# Patient Record
Sex: Female | Born: 1935 | Race: Black or African American | Hispanic: No | Smoking: Never smoker
Health system: Southern US, Community
[De-identification: ages and names within clinical notes are randomized; demographics above are authoritative.]

## PROBLEM LIST (undated history)

## (undated) DIAGNOSIS — I10 Essential (primary) hypertension: Secondary | ICD-10-CM

## (undated) DIAGNOSIS — R55 Syncope and collapse: Secondary | ICD-10-CM

## (undated) DIAGNOSIS — R001 Bradycardia, unspecified: Secondary | ICD-10-CM

## (undated) DIAGNOSIS — I5032 Chronic diastolic (congestive) heart failure: Secondary | ICD-10-CM

## (undated) DIAGNOSIS — Z950 Presence of cardiac pacemaker: Secondary | ICD-10-CM

## (undated) DIAGNOSIS — M1990 Unspecified osteoarthritis, unspecified site: Secondary | ICD-10-CM

## (undated) DIAGNOSIS — I4891 Unspecified atrial fibrillation: Secondary | ICD-10-CM

## (undated) HISTORY — PX: HERNIA REPAIR: SHX51

## (undated) HISTORY — DX: Syncope and collapse: R55

## (undated) HISTORY — DX: Essential (primary) hypertension: I10

## (undated) HISTORY — DX: Unspecified atrial fibrillation: I48.91

## (undated) HISTORY — PX: OTHER SURGICAL HISTORY: SHX169

## (undated) HISTORY — DX: Presence of cardiac pacemaker: Z95.0

## (undated) HISTORY — DX: Bradycardia, unspecified: R00.1

## (undated) HISTORY — DX: Unspecified osteoarthritis, unspecified site: M19.90

## (undated) HISTORY — PX: CHOLECYSTECTOMY: SHX55

## (undated) HISTORY — DX: Chronic diastolic (congestive) heart failure: I50.32

---

## 2010-05-21 ENCOUNTER — Encounter: Payer: Self-pay | Admitting: Physician Assistant

## 2010-06-04 ENCOUNTER — Ambulatory Visit: Payer: Self-pay | Admitting: Internal Medicine

## 2010-06-04 ENCOUNTER — Encounter (INDEPENDENT_AMBULATORY_CARE_PROVIDER_SITE_OTHER): Payer: Self-pay | Admitting: Internal Medicine

## 2010-06-05 ENCOUNTER — Ambulatory Visit: Payer: Self-pay | Admitting: Vascular Surgery

## 2010-06-05 ENCOUNTER — Encounter (INDEPENDENT_AMBULATORY_CARE_PROVIDER_SITE_OTHER): Payer: Self-pay | Admitting: Internal Medicine

## 2010-06-05 ENCOUNTER — Encounter: Payer: Self-pay | Admitting: Internal Medicine

## 2010-06-07 ENCOUNTER — Encounter: Payer: Self-pay | Admitting: Internal Medicine

## 2010-06-13 ENCOUNTER — Ambulatory Visit: Payer: Self-pay | Admitting: Internal Medicine

## 2010-06-15 ENCOUNTER — Encounter: Payer: Self-pay | Admitting: Internal Medicine

## 2010-06-16 ENCOUNTER — Telehealth (INDEPENDENT_AMBULATORY_CARE_PROVIDER_SITE_OTHER): Payer: Self-pay | Admitting: *Deleted

## 2010-07-01 ENCOUNTER — Telehealth (INDEPENDENT_AMBULATORY_CARE_PROVIDER_SITE_OTHER): Payer: Self-pay | Admitting: *Deleted

## 2010-07-09 DIAGNOSIS — I5032 Chronic diastolic (congestive) heart failure: Secondary | ICD-10-CM

## 2010-07-09 DIAGNOSIS — G459 Transient cerebral ischemic attack, unspecified: Secondary | ICD-10-CM | POA: Insufficient documentation

## 2010-07-09 DIAGNOSIS — M4120 Other idiopathic scoliosis, site unspecified: Secondary | ICD-10-CM | POA: Insufficient documentation

## 2010-07-09 DIAGNOSIS — M129 Arthropathy, unspecified: Secondary | ICD-10-CM | POA: Insufficient documentation

## 2010-07-09 DIAGNOSIS — I4891 Unspecified atrial fibrillation: Secondary | ICD-10-CM | POA: Insufficient documentation

## 2010-07-10 ENCOUNTER — Ambulatory Visit: Payer: Self-pay | Admitting: Internal Medicine

## 2010-07-17 ENCOUNTER — Ambulatory Visit: Payer: Self-pay | Admitting: Cardiology

## 2010-07-17 LAB — CONVERTED CEMR LAB: POC INR: 1.2

## 2010-07-22 ENCOUNTER — Ambulatory Visit: Payer: Self-pay | Admitting: Physician Assistant

## 2010-07-24 ENCOUNTER — Ambulatory Visit: Payer: Self-pay | Admitting: Internal Medicine

## 2010-07-24 LAB — CONVERTED CEMR LAB: POC INR: 1.8

## 2010-07-31 ENCOUNTER — Ambulatory Visit: Payer: Self-pay | Admitting: Internal Medicine

## 2010-07-31 LAB — CONVERTED CEMR LAB: POC INR: 1.9

## 2010-08-07 ENCOUNTER — Ambulatory Visit: Payer: Self-pay | Admitting: Internal Medicine

## 2010-08-07 ENCOUNTER — Ambulatory Visit: Payer: Self-pay | Admitting: Cardiovascular Disease

## 2010-08-14 ENCOUNTER — Ambulatory Visit: Payer: Self-pay | Admitting: Internal Medicine

## 2010-08-14 LAB — CONVERTED CEMR LAB: POC INR: 2

## 2010-08-15 ENCOUNTER — Encounter: Payer: Self-pay | Admitting: Cardiovascular Disease

## 2010-09-04 ENCOUNTER — Encounter: Payer: Self-pay | Admitting: Physician Assistant

## 2010-09-10 ENCOUNTER — Telehealth: Payer: Self-pay | Admitting: Internal Medicine

## 2010-09-25 ENCOUNTER — Encounter: Payer: Self-pay | Admitting: Internal Medicine

## 2010-09-25 ENCOUNTER — Inpatient Hospital Stay (HOSPITAL_COMMUNITY): Admission: EM | Admit: 2010-09-25 | Discharge: 2010-06-07 | Payer: Self-pay | Admitting: Emergency Medicine

## 2010-10-02 ENCOUNTER — Ambulatory Visit: Payer: Self-pay | Admitting: Internal Medicine

## 2010-10-02 ENCOUNTER — Encounter: Payer: Self-pay | Admitting: Internal Medicine

## 2010-10-02 DIAGNOSIS — R609 Edema, unspecified: Secondary | ICD-10-CM | POA: Insufficient documentation

## 2010-10-02 DIAGNOSIS — I110 Hypertensive heart disease with heart failure: Secondary | ICD-10-CM | POA: Insufficient documentation

## 2010-10-02 DIAGNOSIS — I509 Heart failure, unspecified: Secondary | ICD-10-CM

## 2010-10-07 ENCOUNTER — Telehealth (INDEPENDENT_AMBULATORY_CARE_PROVIDER_SITE_OTHER): Payer: Self-pay | Admitting: *Deleted

## 2010-10-17 ENCOUNTER — Ambulatory Visit: Payer: Self-pay | Admitting: Internal Medicine

## 2010-10-17 ENCOUNTER — Ambulatory Visit: Payer: Self-pay | Admitting: Cardiology

## 2010-10-21 ENCOUNTER — Encounter: Payer: Self-pay | Admitting: Physician Assistant

## 2010-10-22 ENCOUNTER — Encounter: Payer: Self-pay | Admitting: Physician Assistant

## 2010-10-22 ENCOUNTER — Other Ambulatory Visit: Payer: Self-pay | Admitting: Internal Medicine

## 2010-10-22 LAB — BASIC METABOLIC PANEL
BUN: 15 mg/dL (ref 6–23)
CO2: 32 mEq/L (ref 19–32)
Calcium: 9.8 mg/dL (ref 8.4–10.5)
Chloride: 103 mEq/L (ref 96–112)
Creatinine, Ser: 0.8 mg/dL (ref 0.4–1.2)
GFR: 87.58 mL/min (ref >60.00)
Glucose, Bld: 92 mg/dL (ref 70–99)
Potassium: 4.2 mEq/L (ref 3.5–5.1)
Sodium: 143 mEq/L (ref 135–145)

## 2010-10-27 ENCOUNTER — Ambulatory Visit
Admission: RE | Admit: 2010-10-27 | Discharge: 2010-10-27 | Payer: Self-pay | Source: Home / Self Care | Attending: Physician Assistant | Admitting: Physician Assistant

## 2010-10-27 ENCOUNTER — Other Ambulatory Visit: Payer: Self-pay | Admitting: Physician Assistant

## 2010-10-27 LAB — BASIC METABOLIC PANEL
BUN: 17 mg/dL (ref 6–23)
CO2: 32 mEq/L (ref 19–32)
Calcium: 9.8 mg/dL (ref 8.4–10.5)
Chloride: 104 mEq/L (ref 96–112)
Creatinine, Ser: 1.1 mg/dL (ref 0.4–1.2)
GFR: 58.79 mL/min — ABNORMAL LOW (ref >60.00)
Glucose, Bld: 80 mg/dL (ref 70–99)
Potassium: 3.9 mEq/L (ref 3.5–5.1)
Sodium: 143 mEq/L (ref 135–145)

## 2010-10-28 ENCOUNTER — Encounter: Payer: Self-pay | Admitting: Physician Assistant

## 2010-11-04 ENCOUNTER — Encounter: Payer: Self-pay | Admitting: Physician Assistant

## 2010-11-13 ENCOUNTER — Ambulatory Visit
Admission: RE | Admit: 2010-11-13 | Discharge: 2010-11-13 | Payer: Self-pay | Source: Home / Self Care | Attending: Physician Assistant | Admitting: Physician Assistant

## 2010-11-13 ENCOUNTER — Other Ambulatory Visit: Payer: Self-pay | Admitting: Physician Assistant

## 2010-11-13 DIAGNOSIS — I10 Essential (primary) hypertension: Secondary | ICD-10-CM | POA: Insufficient documentation

## 2010-11-14 ENCOUNTER — Telehealth: Payer: Self-pay | Admitting: Physician Assistant

## 2010-11-14 ENCOUNTER — Encounter: Payer: Self-pay | Admitting: Physician Assistant

## 2010-11-14 LAB — BASIC METABOLIC PANEL
BUN: 17 mg/dL (ref 6–23)
CO2: 33 mEq/L — ABNORMAL HIGH (ref 19–32)
Calcium: 9.7 mg/dL (ref 8.4–10.5)
Potassium: 4.5 mEq/L (ref 3.5–5.1)
Sodium: 142 mEq/L (ref 135–145)

## 2010-11-14 LAB — BRAIN NATRIURETIC PEPTIDE: Pro B Natriuretic peptide (BNP): 174.9 pg/mL — ABNORMAL HIGH (ref 0.0–100.0)

## 2010-11-18 NOTE — Medication Information (Signed)
Summary: rov/ewj  Anticoagulant Therapy  Managed by: Cloyde Reams, RN, BSN Referring MD: Graciela Husbands PCP: Ferdinand Cava, MD Supervising MD: Ladona Ridgel MD, Sharlot Gowda Indication 1: Atrial Fibrillation Lab Used: [facility] [place] Site: [address] INR POC 1.9 INR RANGE 2.0-3.0  Dietary changes: no    Health status changes: no    Bleeding/hemorrhagic complications: no    Recent/future hospitalizations: no    Any changes in medication regimen? yes       Details: changed prilosec to omeprazole  Recent/future dental: no  Any missed doses?: no       Is patient compliant with meds? yes       Allergies: No Known Drug Allergies  Anticoagulation Management History:      The patient is taking warfarin and comes in today for a routine follow up visit.  Positive risk factors for bleeding include an age of 75 years or older and history of CVA/TIA.  The bleeding index is 'intermediate risk'.  Positive CHADS2 values include History of CHF, History of HTN, Age > 41 years old, and Prior Stroke/CVA/TIA.  Anticoagulation responsible provider: Ladona Ridgel MD, Sharlot Gowda.  INR POC: 1.9.  Cuvette Lot#: [id number].  Exp: 08/2011.    Anticoagulation Management Assessment/Plan:      The target INR is 2.0-3.0.  The next INR is due 08/07/2010.  Results were reviewed/authorized by Cloyde Reams, RN, BSN.  She was notified by Cloyde Reams RN.         Prior Anticoagulation Instructions: INR 1.8  Take 1 extra tablet today, then start taking 2 tablets daily except 1 tablet on Mondays, Wednesdays, and Fridays.  Recheck in 1 week.    Current Anticoagulation Instructions: INR 1.9  Take 2 tablets tomorrow, then start taking 2 tablets daily except 1 tablet on Mondays and Fridays.  Recheck in 1 week.

## 2010-11-18 NOTE — Letter (Signed)
Summary: [facility]   Imported By: Roderic Ovens 06/20/2010 15:59:45  _____________________________________________________________________  External Attachment:    Type:   Image     Comment:   External Document

## 2010-11-18 NOTE — Medication Information (Signed)
Summary: [facility]  Anticoagulant Therapy  Managed by: Inactive Referring MD: Graciela Husbands PCP: Ferdinand Cava, MD Supervising MD: Excell Seltzer MD, Casimiro Needle Indication 1: Atrial Fibrillation Lab Used: [facility]  Site: [address] INR RANGE 2.0-3.0          Comments: Pt returning home to [place], [place]. Here Primary Cardiologist Dr Donzetta Kohut daughter states will take over INR dosing. Thus, [facility] report faxed to [phone].  Office # [phone]  Allergies: No Known Drug Allergies  Anticoagulation Management History:      Positive risk factors for bleeding include an age of 19 years or older and history of CVA/TIA.  The bleeding index is 'intermediate risk'.  Positive CHADS2 values include History of CHF, History of HTN, Age > 59 years old, and Prior Stroke/CVA/TIA.  Anticoagulation responsible Felicity Penix: Excell Seltzer MD, Casimiro Needle.  Exp: 08/2011.    Anticoagulation Management Assessment/Plan:      The patient's current anticoagulation dose is Coumadin 5 mg tabs: Use as directed by anticoagulation clinic..  The target INR is 2.0-3.0.  The next INR is due 08/28/2010.  Results were reviewed/authorized by Inactive.         Prior Anticoagulation Instructions: INR 2.0  Take Coumadin 1 tab (5 mg) on all days except Coumadin 0.5 tab (2.5 mg) on Mondays. Return to clinic in 2 weeks.  Please call clinic if you will establish care closer to home.

## 2010-11-18 NOTE — Progress Notes (Signed)
  Phone Note Outgoing Call   Call placed by: Scherrie Bateman, LPN,  June 16, 2010 10:57 AM Call placed to: [facility] Summary of Call: [facility] AWARE TO FORWARD REFERRAL ORDER  TO PMD.  Initial call taken by: Scherrie Bateman, LPN,  June 16, 2010 10:57 AM

## 2010-11-18 NOTE — Medication Information (Signed)
Summary: new afib/sp  Anticoagulant Therapy  Managed by: Weston Brass, PharmD Referring MD: Graciela Husbands PCP: Ferdinand Cava, MD Supervising MD: Shirlee Latch MD, [place] Indication 1: Atrial Fibrillation INR POC 1.2 INR RANGE 2.0-3.0  Dietary changes: yes       Details: eats greens every day  Health status changes: no    Bleeding/hemorrhagic complications: no    Recent/future hospitalizations: no    Any changes in medication regimen? no    Recent/future dental: no  Any missed doses?: no       Is patient compliant with meds? yes       Allergies: No Known Drug Allergies  Anticoagulation Management History:      The patient is taking warfarin and comes in today for a routine follow up visit.  Positive risk factors for bleeding include an age of 75 years or older and history of CVA/TIA.  The bleeding index is 'intermediate risk'.  Positive CHADS2 values include History of CHF, History of HTN, Age > 64 years old, and Prior Stroke/CVA/TIA.  Anticoagulation responsible provider: Shirlee Latch MD, [place].  INR POC: 1.2.  Cuvette Lot#: [id number].  Exp: 08/2011.    Anticoagulation Management Assessment/Plan:      The next INR is due 07/24/2010.  Results were reviewed/authorized by Weston Brass, PharmD.  She was notified by Kennieth Francois.         Current Anticoagulation Instructions: INR 1.2  Take one extra tablet when you get home today, then tomorrow begin new dose of 2 tablets on Sunday, Tuesday, Thursday, and Saturday, and 1 tablet on Monday, Wednesday, and Friday.  Recheck INR in one week.

## 2010-11-18 NOTE — Assessment & Plan Note (Signed)
Summary: 4 week eph/mt   Visit Type:  Follow-up Primary Provider:  Ferdinand Cava, MD   History of Present Illness: Connie Weaver is seen in followup for hospitalization for syncope in whichshe is found to be bradycardic. She also been noted to have atrial fibrillation at a prior hospitalization.  Outpatient monitoring was undertaken to try to clarify the relative contributions of bradycardia and atrial fibrillation.  this demonstrated frequent episodes of atrial fibrillation. There is no significant bradycardia.  Thromboembolic risk factors include gender, age, hypertension, prior TIA.  Current Medications (verified): 1)  Hydrocortisone 1 % Crea (Hydrocortisone) .... Uad 2)  Aggrenox 25-200 Mg Xr12h-Cap (Aspirin-Dipyridamole) .... Two Times A Day 3)  Dok 100 Mg Caps (Docusate Sodium) .... As Needed 4)  Ginkgo Biloba 60 Mg Tabs (Ginkgo Biloba) .... Once Daily 5)  Glucosamine-Chondroitin  Caps (Glucosamine-Chondroit-Vit C-Mn) .... Once Daily 6)  Magnesium Oxide 400 Mg Caps (Magnesium Oxide) .... Once Daily 7)  Naproxen Sodium 220 Mg Tabs (Naproxen Sodium) .... As Needed 8)  Prilosec 40 Mg Cpdr (Omeprazole) .... Once Daily 9)  Tylenol 8 Hour 650 Mg Cr-Tabs (Acetaminophen) .... As Needed 10)  Vitamin C 500 Mg  Tabs (Ascorbic Acid) .... Once Daily 11)  Zinc 25 Mg Tabs (Zinc) .... Once Daily  Allergies (verified): No Known Drug Allergies  Past History:  Past Medical History: Last updated: 07/09/2010 1. Possible TIA.   2. Hypertension.   3. Congestive heart failure.   4. Arthritis.   5. History of atrial fibrillation.   6. Scoliosis.   Vital Signs:  Patient profile:   77 year old female Height:      64 inches Weight:      239 pounds BMI:     41.17 Pulse rate:   71 / minute Pulse rhythm:   regular BP sitting:   132 / 78  (left arm)  Vitals Entered By: Laurance Flatten CMA (July 10, 2010 3:13 PM)  Physical Exam  General:  The patient was alert and oriented in no acute  distress. HEENT Normal.  Neck veins were flat, carotids were brisk.  Lungs were clear.  Heart sounds were regular without murmurs or gallops.  Abdomen was soft with active bowel sounds. There is no clubbing cyanosis or edema. Skin Warm and dry    EKG  Procedure date:  07/10/2010  Findings:      sinus rhythm at 71 Intervals 0.18/0.08/0.39 Axis is -19 Otherwise normal  Impression & Recommendations:  Problem # 1:  ATRIAL FIBRILLATION (ICD-427.31) the patient has atrial fibrillation with a rapid ventricular response. We are going to get caught in a dilemma of tachybradycardia syndrome and the patient will likely end up requiring pacing.  The more immediate issue however is thromboembolic risk reduction. The patient is on aggrenx currently. This is inadequate. We'll begin her on Coumadin. I reviewed extensively with the family for greater than 25 minutes the physiology as well as potential risks and benefits of The following medications were removed from the medication list:    Aggrenox 25-200 Mg Xr12h-cap (Aspirin-dipyridamole) .[name]..[name] Two times a day  Problem # 2:  HYPERTENSION (ICD-401.9) well controlled for now  Patient Instructions: 1)  Your physician has recommended you make the following change in your medication: Start Coumadin. Stop Ginko.

## 2010-11-18 NOTE — Progress Notes (Signed)
Summary: Pt daughter request call have question  Phone Note Call from Patient Call back at Home Phone [phone]   Caller: Daughter/ Connie Weaver Summary of Call: Pt daughter request call Initial call taken by: Judie Grieve,  September 10, 2010 8:21 AM  Follow-up for Phone Call        Spoke with pt's daugher.  Mom wants to take Garlic for cholesterol and daughter wants to make sure will not interfer with Coumadin.  Explained that may not increase INR but can cause increase risk of bleeding because it has some antiplatelet effects.  If mother wants something OTC for cholesterol, suggested red yeast rice or fiber supplements.  Follow-up by: Weston Brass PharmD,  September 10, 2010 8:38 AM

## 2010-11-18 NOTE — Procedures (Signed)
Summary: Summary Report  Summary Report   Imported By: Erle Crocker 07/18/2010 16:28:10  _____________________________________________________________________  External Attachment:    Type:   Image     Comment:   External Document

## 2010-11-18 NOTE — Assessment & Plan Note (Signed)
Summary: rov-shob-holter f/u   Visit Type:  rov Primary Amilah Greenspan:  Ferdinand Cava, MD  CC:  shortness of breath and .  History of Present Illness: Mrs. Clucas is seen in followup for hospitalization for syncope in whichshe is found to be bradycardic. She also been noted to have atrial fibrillation at a prior hospitalization. intercurrent event recording demonstrated a half with a rapid ventricular response. her hospital course is complicated by renal insufficiency aggravated by diuretic and ACE inhibitors both of which were stopped.  He is overall doing much better. She is getting around with less shortness of breath. She does have episodes where she gets short of breath that lasts for a few minutes or so and then resolves. There has been no significant peripheral edema.  a.  Thromboembolic risk factors include gender, age, hypertension, prior TIA.  Current Medications (verified): 1)  Hydrocortisone 1 % Crea (Hydrocortisone) .... Uad 2)  Dok 100 Mg Caps (Docusate Sodium) .... As Needed 3)  Glucosamine-Chondroitin  Caps (Glucosamine-Chondroit-Vit C-Mn) .... Once Daily 4)  Magnesium Oxide 400 Mg Caps (Magnesium Oxide) .... Once Daily 5)  Prilosec 40 Mg Cpdr (Omeprazole) .... Once Daily 6)  Tylenol 8 Hour 650 Mg Cr-Tabs (Acetaminophen) .... As Needed 7)  Vitamin C 500 Mg  Tabs (Ascorbic Acid) .... Once Daily 8)  Warfarin Sodium 2.5 Mg Tabs (Warfarin Sodium) .... Use As Directed By Anticoagualtion Clinic  Allergies (verified): No Known Drug Allergies  Past History:  Past Medical History: Last updated: 07/09/2010 1. Possible TIA.   2. Hypertension.   3. Congestive heart failure.   4. Arthritis.   5. History of atrial fibrillation.   6. Scoliosis.   Past Surgical History: Last updated: 07/09/2010  Status post hernia repair.  Status post cholecystectomy.  Status post carpal tunnel repair.  Family History: Last updated: 07/09/2010  Noncontributory due to the patient's age.     Social History: Last updated: 07/09/2010 The patient currently lives in [place] with her   daughter.  She is from a smaller town outside of this city.  She has 10   people in her family.  She denies any tobacco, alcohol, or illicit drug   use.      Vital Signs:  Patient profile:   77 year old female Height:      64 inches Weight:      253 pounds BMI:     43.58 Pulse rate:   82 / minute BP sitting:   116 / 80  (left arm) Cuff size:   regular  Vitals Entered By: Caralee Ates CMA (August 07, 2010 4:01 PM)  Physical Exam  General:  The patient was alert and oriented in no acute distress.Neck veins were flat, carotids were brisk. Lungs were clear. Heart sounds were irregular without murmurs or gallops. Abdomen was soft with active bowel sounds. There is no clubbing cyanosis; mild edema. She is walking with a walker    Impression & Recommendations:  Problem # 1:  ATRIAL FIBRILLATION (ICD-427.31) she is in atrial fibrillation today and is without symptoms today suggesting atrial fibrillation in of itself is not a major issue. Her Holter/event recorder demonstrated atrial fibrillation with rapid ventricular response occasionally with heart rates in the 150 range.so much of her symptoms may be related to rate.  alternatively the SOB may be 2/2 fluid accumulation Her updated medication list for this problem includes:    Warfarin Sodium 2.5 Mg Tabs (Warfarin sodium) ..... Use as directed by anticoagualtion clinic  Problem # 2:  CHF (ICD-428.0) her CHF is much improved so we will be very careful about the initiation of diuretics Her updated medication list for this problem includes:    Warfarin Sodium 2.5 Mg Tabs (Warfarin sodium) ..... Use as directed by anticoagualtion clinic  Patient Instructions: 1)  Your physician recommends that you schedule a follow-up appointment in: 8 weeks 2)  Your physician recommends that you continue on your current medications as directed. Please  refer to the Current Medication list given to you today.

## 2010-11-18 NOTE — Progress Notes (Signed)
  Phone Note Outgoing Call   Call placed by: Scherrie Bateman, LPN,  July 01, 2010 12:59 PM Call placed to: Patient Summary of Call: DAUGHTER LEFT MESSAGE THAT PT IS GETTING MONITOR WANTS TO KNOW IF PT NEEDS  APPT.HAS APPT IN COMPUTER TO SEE DR Graciela Husbands ON 07/10/10 AT 2:30  ATTEMPTED TO CALL PT AND INFORM OF ABOVE UNABLE TO LEAVE MESSAGE AT NUMBER LISTED IN SYSTEM. Initial call taken by: Scherrie Bateman, LPN,  July 01, 2010 1:02 PM

## 2010-11-18 NOTE — Medication Information (Signed)
Summary: rov/eac  Anticoagulant Therapy  Managed by: Reina Fuse, PharmD Referring MD: Graciela Husbands PCP: Ferdinand Cava, MD Supervising MD: Gala Romney MD, Reuel Boom Indication 1: Atrial Fibrillation Lab Used: [facility] [place] Site: [address] INR POC 2.0 INR RANGE 2.0-3.0  Dietary changes: no    Health status changes: no    Bleeding/hemorrhagic complications: no    Recent/future hospitalizations: no    Any changes in medication regimen? no    Recent/future dental: no  Any missed doses?: no       Is patient compliant with meds? yes       Allergies: No Known Drug Allergies  Anticoagulation Management History:      The patient is taking warfarin and comes in today for a routine follow up visit.  Positive risk factors for bleeding include an age of 75 years or older and history of CVA/TIA.  The bleeding index is 'intermediate risk'.  Positive CHADS2 values include History of CHF, History of HTN, Age > 79 years old, and Prior Stroke/CVA/TIA.  Anticoagulation responsible provider: Bensimhon MD, Reuel Boom.  INR POC: 2.0.  Cuvette Lot#: [id number].  Exp: 08/2011.    Anticoagulation Management Assessment/Plan:      The patient's current anticoagulation dose is Coumadin 5 mg tabs: Use as directed by anticoagulation clinic..  The target INR is 2.0-3.0.  The next INR is due 08/28/2010.  Results were reviewed/authorized by Reina Fuse, PharmD.  She was notified by Reina Fuse PharmD.         Prior Anticoagulation Instructions: INR 1.8  Take an extra tablet today.  Then Start NEW dosing schedule of 2 tablets every day, except 1 tablet on Friday.  Return to clinic in 1 week.    Current Anticoagulation Instructions: INR 2.0  Take Coumadin 1 tab (5 mg) on all days except Coumadin 0.5 tab (2.5 mg) on Mondays. Return to clinic in 2 weeks.  Please call clinic if you will establish care closer to home.  Prescriptions: COUMADIN 5 MG TABS (WARFARIN SODIUM) Use as directed by  anticoagulation clinic.  #[phone] x 0   Entered by:   Reina Fuse PharmD   Authorized by:   Dolores Patty, MD, [facility]   Signed by:   Reina Fuse PharmD on 08/14/2010   Method used:   Electronically to        [facility] [phone] (retail)       [address]       [place], [place]  [address]       Ph: [phone]       Fax: [phone]   RxID:   [id number]

## 2010-11-18 NOTE — Miscellaneous (Signed)
  Clinical Lists Changes  Medications: Added new medication of HYDROCORTISONE 1 % CREA (HYDROCORTISONE) UAD Added new medication of AGGRENOX 25-200 MG XR12H-CAP (ASPIRIN-DIPYRIDAMOLE) two times a day Added new medication of DOK 100 MG CAPS (DOCUSATE SODIUM) as needed Added new medication of GINKGO BILOBA 60 MG TABS (GINKGO BILOBA) once daily Added new medication of GLUCOSAMINE-CHONDROITIN  CAPS (GLUCOSAMINE-CHONDROIT-VIT C-MN) once daily Added new medication of MAGNESIUM OXIDE 400 MG CAPS (MAGNESIUM OXIDE) once daily Added new medication of NAPROXEN SODIUM 220 MG TABS (NAPROXEN SODIUM) as needed Added new medication of PRILOSEC 40 MG CPDR (OMEPRAZOLE) once daily Added new medication of TYLENOL 8 HOUR 650 MG CR-TABS (ACETAMINOPHEN) as needed Added new medication of VITAMIN C 500 MG  TABS (ASCORBIC ACID) once daily Added new medication of ZINC 25 MG TABS (ZINC) once daily

## 2010-11-18 NOTE — Medication Information (Signed)
Summary: rov/jm  Anticoagulant Therapy  Managed by: Cloyde Reams, RN, BSN Referring MD: Graciela Husbands PCP: Ferdinand Cava, MD Supervising MD: Johney Frame MD, Fayrene Fearing Indication 1: Atrial Fibrillation Lab Used: [facility] [place] Site: [address] INR POC 1.8 INR RANGE 2.0-3.0  Dietary changes: no    Health status changes: no    Bleeding/hemorrhagic complications: no    Recent/future hospitalizations: no    Any changes in medication regimen? no    Recent/future dental: no  Any missed doses?: no       Is patient compliant with meds? yes      Comments: Pt has been alternating 2 tablets/1 tablet every other day.   Allergies: No Known Drug Allergies  Anticoagulation Management History:      The patient is taking warfarin and comes in today for a routine follow up visit.  Positive risk factors for bleeding include an age of 27 years or older and history of CVA/TIA.  The bleeding index is 'intermediate risk'.  Positive CHADS2 values include History of CHF, History of HTN, Age > 58 years old, and Prior Stroke/CVA/TIA.  Anticoagulation responsible provider: Allred MD, Fayrene Fearing.  INR POC: 1.8.  Cuvette Lot#: [id number].  Exp: 08/2011.    Anticoagulation Management Assessment/Plan:      The target INR is 2.0-3.0.  The next INR is due 07/31/2010.  Results were reviewed/authorized by Cloyde Reams, RN, BSN.  She was notified by Cloyde Reams RN.         Prior Anticoagulation Instructions: INR 1.2  Take one extra tablet when you get home today, then tomorrow begin new dose of 2 tablets on Sunday, Tuesday, Thursday, and Saturday, and 1 tablet on Monday, Wednesday, and Friday.  Recheck INR in one week.    Current Anticoagulation Instructions: INR 1.8  Take 1 extra tablet today, then start taking 2 tablets daily except 1 tablet on Mondays, Wednesdays, and Fridays.  Recheck in 1 week.

## 2010-11-18 NOTE — Medication Information (Signed)
Summary: Connie Weaver  Anticoagulant Therapy  Managed by: Eda Keys, PharmD Referring MD: Graciela Husbands PCP: Ferdinand Cava, MD Supervising MD: Eden Emms MD, Theron Arista Indication 1: Atrial Fibrillation Lab Used: [facility] [place] Site: [address] INR POC 1.8 INR RANGE 2.0-3.0  Dietary changes: no    Health status changes: no    Bleeding/hemorrhagic complications: no    Recent/future hospitalizations: no    Any changes in medication regimen? no    Recent/future dental: no  Any missed doses?: no       Is patient compliant with meds? yes       Allergies: No Known Drug Allergies  Anticoagulation Management History:      The patient is taking warfarin and comes in today for a routine follow up visit.  Positive risk factors for bleeding include an age of 75 years or older and history of CVA/TIA.  The bleeding index is 'intermediate risk'.  Positive CHADS2 values include History of CHF, History of HTN, Age > 29 years old, and Prior Stroke/CVA/TIA.  Anticoagulation responsible provider: Eden Emms MD, Theron Arista.  INR POC: 1.8.  Cuvette Lot#: [id number].  Exp: 08/2011.    Anticoagulation Management Assessment/Plan:      The target INR is 2.0-3.0.  The next INR is due 08/14/2010.  Results were reviewed/authorized by Eda Keys, PharmD.         Prior Anticoagulation Instructions: INR 1.9  Take 2 tablets tomorrow, then start taking 2 tablets daily except 1 tablet on Mondays and Fridays.  Recheck in 1 week.    Current Anticoagulation Instructions: INR 1.8  Take an extra tablet today.  Then Start NEW dosing schedule of 2 tablets every day, except 1 tablet on Friday.  Return to clinic in 1 week.

## 2010-11-18 NOTE — Procedures (Signed)
Summary: Summary Report  Summary Report   Imported By: Erle Crocker 07/17/2010 14:36:25  _____________________________________________________________________  External Attachment:    Type:   Image     Comment:   External Document

## 2010-11-20 ENCOUNTER — Encounter: Payer: Self-pay | Admitting: Physician Assistant

## 2010-11-20 LAB — CONVERTED CEMR LAB
BUN: 14 mg/dL (ref 6–23)
BUN: 20 mg/dL
CO2: 26 meq/L
CO2: 32 meq/L (ref 19–32)
Calcium: 9.8 mg/dL (ref 8.4–10.5)
Chloride: 105 meq/L (ref 96–112)
Creatinine, Ser: 1 mg/dL (ref 0.4–1.2)
Creatinine, Ser: 1.04 mg/dL
GFR calc non Af Amer: 68.49 mL/min (ref >60.00)
Glucose, Bld: 88 mg/dL (ref 70–99)
Potassium: 4.2 meq/L
Potassium: 4.4 meq/L (ref 3.5–5.1)

## 2010-11-20 NOTE — Assessment & Plan Note (Signed)
Summary: per check out/sf   Visit Type:  Follow-up Primary Provider:  Ferdinand Cava, MD  CC:  shortness of breath.  History of Present Illness: Connie Weaver is seen in followup for hospitalization for syncope in whichshe is found to be bradycardic. She also been noted to have atrial fibrillation at a prior hospitalization. intercurrent event recording demonstrated afib with a rapid ventricular response. her hospital course was complicated by renal insufficiency aggravated by diuretic and ACE inhibitors both of which were stopped.Looking at the discharge papers from her [facility] hospitalization but Gunnar Fusi stopped and then 2 for reasons that are not clear. I'm not sure the blood pressure or bradycardia.  She continues to suffer from exercise intolerance. She also is having problems with peripheral edema.    Thromboembolic risk factors include gender, age, hypertension, prior TIA.  Hypertension History:      Positive major cardiovascular risk factors include female age 75 years old or older and hypertension.        Positive history for target organ damage include cardiac end organ damage (either CHF or LVH) and prior stroke (or TIA).     Problems Prior to Update: 1)  Atrial Fibrillation  (ICD-427.31) 2)  Scoliosis  (ICD-737.30) 3)  Arthritis  (ICD-716.90) 4)  CHF  (ICD-428.0) 5)  Hypertension  (ICD-401.9) 6)  Tia  (ICD-435.9)  Current Medications (verified): 1)  Hydrocortisone 1 % Crea (Hydrocortisone) .... Uad 2)  Dok 100 Mg Caps (Docusate Sodium) .... As Needed 3)  Cvs Glucosamine-Chondroitin 500-400 Mg Tabs (Glucosamine-Chondroitin) .... By Mouth Once Daily 4)  Magnesium Oxide 400 Mg Caps (Magnesium Oxide) .... Once Daily 5)  Prilosec 40 Mg Cpdr (Omeprazole) .... Once Daily 6)  Tylenol 8 Hour 650 Mg Cr-Tabs (Acetaminophen) .... As Needed 7)  Vitamin C 500 Mg  Tabs (Ascorbic Acid) .... Once Daily 8)  Coumadin 5 Mg Tabs (Warfarin Sodium) .... Use As Directed By Anticoagulation  Clinic. 9)  Zinc 25 Mg Tabs (Zinc) .... By Mouth Once Daily  Allergies (verified): No Known Drug Allergies  Past History:  Past Medical History: Last updated: 07/09/2010 1. Possible TIA.   2. Hypertension.   3. Congestive heart failure.   4. Arthritis.   5. History of atrial fibrillation.   6. Scoliosis.   Past Surgical History: Last updated: 07/09/2010  Status post hernia repair.  Status post cholecystectomy.  Status post carpal tunnel repair.  Family History: Last updated: 07/09/2010  Noncontributory due to the patient's age.   Social History: Last updated: 07/09/2010 The patient currently lives in [place] with her   daughter.  She is from a smaller town outside of this city.  She has 10   people in her family.  She denies any tobacco, alcohol, or illicit drug   use.      Vital Signs:  Patient profile:   77 year old female Height:      64 inches Weight:      249.75 pounds BMI:     43.02 Pulse rate:   56 / minute BP sitting:   128 / 83  (left arm) Cuff size:   regular  Vitals Entered By: Caralee Ates CMA (October 02, 2010 2:57 PM)  Physical Exam  General:  The patient was alert and oriented in no acute distress. She is sitting bent over in her chair looking up as I walk in. She seems to nod off periodically as I talk.Neck veins were flat, carotids were brisk. rpad  and irregular without murmurs or gallops.  Abdomen was soft with active bowel sounds. There is no clubbing cyanosis 2+ peripheral edema    Impression & Recommendations:  Problem # 1:  ATRIAL FIBRILLATION (ICD-427.31) She continues to have atrial fibrillation with a rapid ventricular response. At rest she 110. Will be to try to slow this down. Eyes don't know whether hypotension or bradycardia prompted the discontinuation of the Lopressor. We'll try and review the records from [facility].  In the event that it was bradycardia, then we may will be to proceed with pacing to allow for  treatment of her tachycardia. In any event it was hypotension if we encroach upon that again, we may have to first a great control and move towards rhythm control with amiodarone. I did discuss this today with the daughter including the potential for side effects.  She also complains of being cold which is likely related to her Coumadin  Orders: TLB-BMP (Basic Metabolic Panel-BMET) (80048-METABOL) TLB-Magnesium (Mg) (83735-MG)  Problem # 2:  HYPERTENSION, HEART CONTROLLED W/ CHF (ICD-402.11)  her blood pressure is relatively well controlled at this time.  her blood pressure is relatively well controlled at this time. Her updated medication list for this problem includes:    Furosemide 20 Mg Tabs (Furosemide) ..... Once daily    Metoprolol Succinate 50 Mg Xr24h-tab (Metoprolol succinate) ..... Once daily  Problem # 3:  EDEMA (ICD-782.3) I suspect that this is administration of diastolic heart failure in the setting of atrial fibrillation. Review of her echo from the summer demonstrated normal left ventricular function and normal left atrial size. We'll begin her on a low dose diuretic. Because of her problems with renal insufficiency prior to this, we will check her metabolic profile today. He'll be rechecked in 2 weeks time. We'll arrange for her to come back and see  Tereso Newcomer  for assessment both heart rate and up titration of her beta blocker if possible , and her kidney function  Hypertension Assessment/Plan:      The patient's hypertensive risk group is category C: Target organ damage and/or diabetes.  Today's blood pressure is 128/83.    Patient Instructions: 1)  Your physician recommends that you schedule a follow-up appointment in: 2 week follow-up appointment with Tereso Newcomer, PA for BMET and Heart Rate measurement.  2)  Your physician recommends that you have lab work today. 3)  Your physician has recommended you make the following change in your medication: 20 mg Lasix and 50  mg Metoprolol.  Prescriptions: METOPROLOL SUCCINATE 50 MG XR24H-TAB (METOPROLOL SUCCINATE) once daily  #90 x 3   Entered by:   Claris Gladden RN   Authorized by:   Nathen May, MD, [facility]   Signed by:   Claris Gladden RN on 10/02/2010   Method used:   Electronically to        [facility] [phone]* (retail)       [address]       [place], [place]  [address]       Ph: [phone]       Fax: [phone]   RxID:   [id number] FUROSEMIDE 20 MG TABS (FUROSEMIDE) once daily  #90 x 3   Entered by:   Claris Gladden RN   Authorized by:   Nathen May, MD, [facility]   Signed by:   Claris Gladden RN on 10/02/2010   Method used:   Electronically to        [facility] [phone]* (retail)  [address]       [place], [place]  [address]       Ph: [phone]       Fax: [phone]   RxID:   [id number]   Appended Document: per check out/sf 12 lead ect afib 90

## 2010-11-20 NOTE — Progress Notes (Addendum)
  Phone Note Outgoing Call   Summary of Call: I reviewed her labs.  She stil has some fluid on her based on her elevated BNP. Increase Lasix to 60 mg once daily.   Repeat bmet and bnp in one week.  Tell her I spoke with Dr. Graciela Husbands.  When she was at [facility] in August she was in sinus rhythm.  Ask if she has worse symptoms (feels bad, short of breath, etc) now.  In other words did she feel better in August or does she feel no different now?  If she is not sure, that is ok.  We will check her heart rate again at her next visit. Depending on how she feels and how her heart rate is doing, we will decide on whether to adjust her medicines to control heart rate vs. trying to get her in to normal rhythm again.  So, other than the above changes in her lasix and the ones we made yesterday, no other medication changes.  Initial call taken by: Brynda Rim,  November 14, 2010 1:46 PM  Follow-up for Phone Call        Left message to call back. Dossie Arbour, RN, BSN  November 14, 2010 2:27 PM   Additional Follow-up for Phone Call Additional follow up Details #1::        pt rtn call [phone] Connie Weaver  November 14, 2010 4:30 PM  Spoke with Connie Weaver, pt's daughter, and pt. Instructions given as above for increased Lasix and lab work.  Pt will go to Dr. Rollene Rotunda office in [place] where she lives on February 3rd for lab work.  His office is now closed. Will fax order when office open and fax number can be determined.  Office phone number is [phone]. Pt. states it is hard to remember if she felt better when in [place] in August. Additional Follow-up by: Dossie Arbour, RN, BSN,  November 14, 2010 4:56 PM     Appended Document:  Order for bmp,bnp to be done 11/21/10 faxed to Dr. Rollene Rotunda office--Fax number - [phone]

## 2010-11-20 NOTE — Assessment & Plan Note (Addendum)
Summary: F3W   Visit Type:  3 wk f/u Primary Provider:  Ferdinand Cava, MD  CC:  sob...edema/feet/legs...denies any cp.....  History of Present Illness: Primary Electrophysiologist:  Dr. Sherryl Manges  Connie Weaver is a 77 yo female with a history of atrial fibrillation and diastolic heart failure.  She saw Dr. Graciela Husbands several weeks ago and Toprol XL was added to her medical regimen for rate control as well as low-dose furosemide.  I saw her in follow up and increased her Toprol and Lasix and had her return today for follow up.  She has occ. dyspnea.  She is not that ambulatory.  She denies palps.  She denies chest pain.  She sleeps on one big pillow.  No PND.  Her lower ext edema is improved.  She is tolerating her medications well.  Current Medications (verified): 1)  Dok 100 Mg Caps (Docusate Sodium) .... As Needed 2)  Cvs Glucosamine-Chondroitin 500-400 Mg Tabs (Glucosamine-Chondroitin) .... By Mouth Once Daily 3)  Magnesium Oxide 400 Mg Caps (Magnesium Oxide) .... Once Daily 4)  Prilosec 40 Mg Cpdr (Omeprazole) .... Once Daily 5)  Tylenol 8 Hour 650 Mg Cr-Tabs (Acetaminophen) .... As Needed 6)  Vitamin C 500 Mg  Tabs (Ascorbic Acid) .... Once Daily 7)  Coumadin 5 Mg Tabs (Warfarin Sodium) .... Use As Directed By Anticoagulation Clinic. 8)  Zinc 25 Mg Tabs (Zinc) .... By Mouth Once Daily 9)  Furosemide 40 Mg Tabs (Furosemide) .[name].. 1 Tab Once Daily 10)  Metoprolol Succinate 50 Mg Xr24h-Tab (Metoprolol Succinate) .[name].. 1 Tab Two Times A Day  Allergies (verified): No Known Drug Allergies  Past History:  Past Medical History: h/o  Possible TIA.  Hypertension.  Congestive heart failure (diastolic)   a. echo 05/2010: EF 60-65%; mild AI, mild MR; mod to severe TR; PASP 42    Arthritis.  Atrial fibrillation.    a. coumadin followed by PCP in [facility], [place]  Review of Systems       As per  the HPI.  All other systems reviewed and negative.   Vital Signs:  Patient profile:    77 year old female Height:      64 inches Weight:      251.25 pounds BMI:     43.28 Pulse rate:   95 / minute Pulse rhythm:   irregular BP sitting:   122 / 74  (left arm) Cuff size:   large  Vitals Entered By: Danielle Rankin, CMA (November 13, 2010 3:06 PM)  Physical Exam  General:  Well nourished, well developed, in no acute distress HEENT: normal Neck: I cannot appreciate JVD Cardiac:  normal S1, S2; irreg irreg rhythm Lungs:  faint bibasilar crackles; no wheezing Abd: soft, nontender, no hepatomegaly Ext: Trace edema bilat LE Skin: warm and dry Neuro:  CNs 2-12 intact, no focal abnormalities noted    EKG  Procedure date:  11/13/2010  Findings:      AFib HR 95 leftward axis NSSTTW changes  Impression & Recommendations:  Problem # 1:  ATRIAL FIBRILLATION (ICD-427.31) Her rate is better.  But, I suspect she is still having higher heart rates throughout the day.  I will gently adjust her Toprol to 75 mg in the morning and 50 mg in the evening.  I will discuss her case further with Dr. Graciela Husbands regarding our plan for her rhythm going forward.  Orders: EKG w/ Interpretation (93000) TLB-BMP (Basic Metabolic Panel-BMET) (80048-METABOL) TLB-BNP (B-Natriuretic Peptide) (83880-BNPR)  Problem # 2:  CHF (ICD-428.0)  I think her volume is probably ok.  I will check a bmet and bnp today.  If her renal fxn is ok and her bnp is significantly elevated, I will adjust her diuresis.  Otherwise, she will remain on her current dose.  Orders: EKG w/ Interpretation (93000) TLB-BMP (Basic Metabolic Panel-BMET) (80048-METABOL) TLB-BNP (B-Natriuretic Peptide) (83880-BNPR)  Problem # 3:  HYPERTENSION (ICD-401.9) Controlled.  Problem # 4:  TIA (ICD-435.9) I received her records from [facility] in [place], [place].  She was admitted the first week of August 2011 with slurred speech and right sided weakness. She had an MRI that was neg for CVA and carotids that were negative for  ICA stenosis.  She had an echo with normal LVF.  She was in AFib at that time.  She was not felt to be a coumadin candidate.  Neurology recommended Aggrenox.  Of note, her creatinine was 2.8 upon admxn while being on Lasix 80 mg and Spironolactone 100 mg once daily.  Her diuretics were d/c'd and her creatinine improved.  She was then admitted in [facility] to [facility] with syncope?/near-syncope and sinus bradycardia.  Of note, her K+ was 5.7 and her creat was 1.9 at that time.  This is when she was seen by Dr. Graciela Husbands in initial consultation.  She is now on coumadin and this is followed by her PCP.  Patient Instructions: 1)  Labwork today: bmet/bnp (161.09;604.54). 2)  Increase Toprol XL (metoprolol succ) to 50mg  one & 1/2 tablets in the morning and one tablet in the evening. 3)  Your physician recommends that you schedule a follow-up appointment in: 4-5 weeks with Dr. Graciela Husbands.  Appended Document: F3W I discussed her case with Dr. Graciela Husbands.  We will try to review her symptoms.  She was in normal sinus rhythm in the summertime when we saw her at [facility].  If her symptoms now are clearly worse than they were when she was noted to be in sinus rhythm, we will try to adjust her therapy.  If her symptoms are the same as they were when she was in sinus rhythm, her therapy will just be continued.  If her heart rate is controlled and her symptoms now are worse then we will consider antiarrhythmic medication and cardioversion.  If her symptoms are worse now and her heart rate is uncontrolled we will adjust her heart rate controlling medications.  Appended Document: F3W Patient contacted and she was unsure if her symptoms were better when she was in NSR.  Her symptoms will have to be reassessed when she is seen again in the office.

## 2010-11-20 NOTE — Assessment & Plan Note (Signed)
Summary: 2WK F/U SL   Visit Type:  Follow-up Primary Provider:  Ferdinand Cava, MD  CC:  SOB and Feet and leg swellingf.  History of Present Illness: Primary Electrophysiologist:  Dr. Sherryl Manges  Connie Weaver is a 77 yo female with a history of atrial fibrillation and diastolic heart failure.  She saw Dr. Graciela Husbands about 3 weeks ago and Toprol XL was added to her medical regimen for rate control as well as low-dose furosemide.  Basic metabolic panel at that time demonstrated a normal creatinine of 1.0.  She returns for followup today.  She feels like her edema is better with the Lasix.  She continues to complain of dyspnea with exertion.  She really describes NYHA class III symptoms.  She walks a walker and is limited functionally.  She does report some orthopnea.  She denies PND.  She denies syncope or palpitations.  She denies chest pain.  Current Medications (verified): 1)  Dok 100 Mg Caps (Docusate Sodium) .... As Needed 2)  Cvs Glucosamine-Chondroitin 500-400 Mg Tabs (Glucosamine-Chondroitin) .... By Mouth Once Daily 3)  Magnesium Oxide 400 Mg Caps (Magnesium Oxide) .... Once Daily 4)  Prilosec 40 Mg Cpdr (Omeprazole) .... Once Daily 5)  Tylenol 8 Hour 650 Mg Cr-Tabs (Acetaminophen) .... As Needed 6)  Vitamin C 500 Mg  Tabs (Ascorbic Acid) .... Once Daily 7)  Coumadin 5 Mg Tabs (Warfarin Sodium) .... Use As Directed By Anticoagulation Clinic. 8)  Zinc 25 Mg Tabs (Zinc) .... By Mouth Once Daily 9)  Furosemide 20 Mg Tabs (Furosemide) .... Once Daily 10)  Metoprolol Succinate 50 Mg Xr24h-Tab (Metoprolol Succinate) .... Once Daily  Allergies (verified): No Known Drug Allergies  Past History:  Past Medical History: Last updated: 07/09/2010 1. Possible TIA.   2. Hypertension.   3. Congestive heart failure.   4. Arthritis.   5. History of atrial fibrillation.   6. Scoliosis.   Vital Signs:  Patient profile:   77 year old female Height:      64 inches Weight:      252.50  pounds BMI:     43.50 Pulse rate:   100 / minute BP sitting:   132 / 79  (left arm) Cuff size:   large  Vitals Entered By: Haze Boyden, CMA (October 22, 2010 1:37 PM)  Physical Exam  General:  Well nourished, well developed, in no acute distress HEENT: normal Neck: + JVD Cardiac:  normal S1, S2; irreg irreg rhythm Lungs:  bibasilar crackles; no wheezing Abd: soft, nontender, no hepatomegaly Ext: Tr-1+ edema bilat LE Skin: warm and dry Neuro:  CNs 2-12 intact, no focal abnormalities noted    EKG  Procedure date:  10/22/2010  Findings:      Coarse Atrial Fibrillation heart rate 100 Left axis deviation Poor R wave progression No ischemic changes  Impression & Recommendations:  Problem # 1:  ATRIAL FIBRILLATION (ICD-427.31) Rate is still uncontrolled.  Her Toprol will be increased to 50 mg b.i.d. I will bring her back in followup with Dr. Graciela Husbands or myself in 3 weeks.  Orders: EKG w/ Interpretation (93000)  Problem # 2:  CHF (ICD-428.0) Followup basic metabolic panel is pending today. I think she still has more volume to lose.  By our scales her weight is really not changed.  She reports her edema has improved.  However her neck veins are elevated and she has crackles in her bases on lung exam.  I recommend she increase her Lasix to 40 mg a  day.  She'll have another basic metabolic panel done in one week.  Patient Instructions: 1)  Your physician recommends that you schedule a follow-up appointment in:  YOU HAVE APPOINTMENT TO SEE Oney Tatlock, PA ON 11/13/10 @ 2:45 PM.  2)  Your physician recommends that you return for lab work in: 1 WEEK REPEAT BMET. 3)  Your physician has recommended you make the following change in your medication: INCREASE METOPROLOL SUCCINATE 50 MG TWICE DAILY. ALSO INCREASE FUROSEMIDE 40 MG ONCE DAILY. Prescriptions: FUROSEMIDE 40 MG TABS (FUROSEMIDE) 1 TAB once daily  #30 x 3   Entered by:   Danielle Rankin, CMA   Authorized by:   Tereso Newcomer  PA-C   Signed by:   Danielle Rankin, CMA on 10/22/2010   Method used:   Electronically to        [facility] [phone]* (retail)       [address]       [place], [place]  [address]       Ph: [phone]       Fax: [phone]   RxID:   [id number]  I have personally reviewed the prescriptions today for accuracy.Tereso Newcomer PA-C  October 22, 2010 5:22 PM

## 2010-11-20 NOTE — Progress Notes (Signed)
  ROI faxed to: 1. [facility] @ [phone] 2. Dr.Steven Manuli @ [phone] 3. Dr.Safille @ [phone] [facility]  October 07, 2010 9:00 AM     Appended Document:  Records received from [facility] to [facility]   Appended Document:  Records received from Dr.Steven [facility] Office gave to [place]

## 2010-11-26 ENCOUNTER — Encounter: Payer: Self-pay | Admitting: Physician Assistant

## 2010-12-01 ENCOUNTER — Encounter (INDEPENDENT_AMBULATORY_CARE_PROVIDER_SITE_OTHER): Payer: Self-pay | Admitting: *Deleted

## 2010-12-01 ENCOUNTER — Encounter: Payer: Self-pay | Admitting: Physician Assistant

## 2010-12-04 ENCOUNTER — Ambulatory Visit: Payer: Self-pay | Admitting: Internal Medicine

## 2010-12-04 ENCOUNTER — Encounter: Payer: Self-pay | Admitting: Internal Medicine

## 2010-12-04 ENCOUNTER — Ambulatory Visit (INDEPENDENT_AMBULATORY_CARE_PROVIDER_SITE_OTHER): Payer: Medicare Other | Admitting: Internal Medicine

## 2010-12-04 DIAGNOSIS — I10 Essential (primary) hypertension: Secondary | ICD-10-CM

## 2010-12-04 DIAGNOSIS — R609 Edema, unspecified: Secondary | ICD-10-CM

## 2010-12-04 DIAGNOSIS — I5032 Chronic diastolic (congestive) heart failure: Secondary | ICD-10-CM

## 2010-12-04 DIAGNOSIS — I4891 Unspecified atrial fibrillation: Secondary | ICD-10-CM

## 2010-12-04 NOTE — Miscellaneous (Signed)
  Clinical Lists Changes  Observations: Added new observation of CREATININE: 1.04 mg/dL (16/07/9603 54:09) Added new observation of BUN: 20 mg/dL (81/19/1478 29:56) Added new observation of CO2 PLSM/SER: 26 meq/L (11/20/2010 17:51) Added new observation of CL SERUM: 104 meq/L (11/20/2010 17:51) Added new observation of K SERUM: 4.2 meq/L (11/20/2010 17:51) Added new observation of NA: 143 meq/L (11/20/2010 17:51)  Appended Document:  notify patient renal fxn and K+ ok continue same meds  Appended Document:  CMA LMOM on pt's daughter home # labs ok. Danielle Rankin, CMA

## 2010-12-10 NOTE — Medication Information (Signed)
Summary: Lab Orders  Lab Orders   Imported By: Marylou Mccoy 12/03/2010 14:46:22  _____________________________________________________________________  External Attachment:    Type:   Image     Comment:   External Document

## 2010-12-10 NOTE — Letter (Signed)
Summary: [facility] 2011  [facility] 2011   Imported By: Marylou Mccoy 12/03/2010 15:09:31  _____________________________________________________________________  External Attachment:    Type:   Image     Comment:   External Document

## 2010-12-10 NOTE — Letter (Signed)
Summary: Results Follow-up  [facility], Main Office  [address]   [place], [place] [address]   Phone: [phone]  Fax: [phone]     December 01, 2010 MRN: [id number]   [facility] [address] [place], [place]  [address]   Dear Ms. Mccahill,  We have received the  lab results from your recent tests and have been unable to contact you.  Please call our office at the number listed above so that Tereso Newcomer, PA-C                         or his nurse may review the results with you.    Thank you,  [facility] Danielle Rankin, CMA

## 2010-12-16 NOTE — Assessment & Plan Note (Signed)
Summary: rov.gd   Visit Type:  Follow-up Primary Provider:  Ferdinand Cava, MD  CC:  Bilateral leg edema.  History of Present Illness: b  Connie Weaver is a 77 yo female with a history of atrial fibrillation and diastolic heart failure.  She saw Dr. Graciela Husbands several weeks ago and Toprol XL was added to her medical regimen for rate control as well as low-dose furosemide. SHe has done much better.  She has occ. dyspnea.  She is not that ambulatory.  She denies palps.  She denies chest pain.  She sleeps on one big pillow.  No PND.  Her lower ext edema is improved.  She is tolerating her medications well. her fatigue is improved compared to 6 months ago  laboratories from her home physician had potassium of 4.2 earlier this month  Current Medications (verified): 1)  Dok 100 Mg Caps (Docusate Sodium) .... As Needed 2)  [facility] Glucosamine-Chondroitin 500-400 Mg Tabs (Glucosamine-Chondroitin) .... By Mouth Once Daily 3)  Magnesium Oxide 400 Mg Caps (Magnesium Oxide) .... Once Daily 4)  Prilosec 40 Mg Cpdr (Omeprazole) .... Once Daily 5)  Tylenol 8 Hour 650 Mg Cr-Tabs (Acetaminophen) .... As Needed 6)  Vitamin C 500 Mg  Tabs (Ascorbic Acid) .... Once Daily 7)  Coumadin 5 Mg Tabs (Warfarin Sodium) .... Use As Directed By Anticoagulation Clinic. 8)  Zinc 25 Mg Tabs (Zinc) .... By Mouth Once Daily 9)  Furosemide 40 Mg Tabs (Furosemide) .[name].. 1 Tab Once Daily 10)  Metoprolol Succinate 50 Mg Xr24h-Tab (Metoprolol Succinate) .... Take One & 1/2 Tablets in The Morning and One Tablet in The Evening  Allergies: No Known Drug Allergies  Past History:  Past Medical History: Last updated: 11/13/2010 h/o  Possible TIA.  Hypertension.  Congestive heart failure (diastolic)   a. echo 05/2010: EF 60-65%; mild AI, mild MR; mod to severe TR; PASP 42    Arthritis.  Atrial fibrillation.    a. coumadin followed by PCP in [place], [place]  Family History: Reviewed history from 07/09/2010 and no changes  required.  Noncontributory due to the patient's age.   Social History: Reviewed history from 07/09/2010 and no changes required. The patient currently lives in [place] with her   daughter.  She is from a smaller town outside of this city.  She has 10   people in her family.  She denies any tobacco, alcohol, or illicit drug   use.      Vital Signs:  Patient profile:   77 year old female Height:      64 inches Weight:      250 pounds BMI:     43.07 Pulse rate:   61 / minute Pulse rhythm:   regular Resp:     18 per minute BP sitting:   118 / 70  (left arm) Cuff size:   large  Vitals Entered By: Vikki Ports (December 04, 2010 2:25 PM)  Physical Exam  General:  The patient was alert and oriented in no acute distress. HEENT Normal.  Neck veins were flat, carotids were brisk.  Lungs were clear.  Heart sounds were regular without murmurs or gallops.  Abdomen was soft with active bowel sounds. There is no clubbing cyanosis; 1+ edema Skin Warm and dry    EKG  Procedure date:  12/04/2010  Findings:      sinuses 61 intervals 0.17 5.08/0.42 Axis is -12 Otherwise normal  Impression & Recommendations:  Problem # 1:  HYPERTENSION, HEART CONTROLLED W/ CHF (ICD-402.11) much  improved on current medications Her updated medication list for this problem includes:    Furosemide 40 Mg Tabs (Furosemide) .[name]... 1 tab once daily    Metoprolol Succinate 50 Mg Xr24h-tab (Metoprolol succinate) .[name]... Take one & 1/2 tablets in the morning and one tablet in the evening  Problem # 2:  ATRIAL FIBRILLATION (ICD-427.31)  as converted to sinus rhythm. We'll continue current meds Her updated medication list for this problem includes:    Coumadin 5 Mg Tabs (Warfarin sodium) ..... Use as directed by anticoagulation clinic.    Metoprolol Succinate 50 Mg Xr24h-tab (Metoprolol succinate) .[name]... Take one & 1/2 tablets in the morning and one tablet in the evening  Orders: EKG w/ Interpretation  (93000)  Problem # 3:  EDEMA (ICD-782.3) we'll increase her furosemide to every other day for the next 2 weeks  Problem # 4:  CHF (ICD-428.0) much improved Her updated medication list for this problem includes:    Coumadin 5 Mg Tabs (Warfarin sodium) ..... Use as directed by anticoagulation clinic.    Furosemide 40 Mg Tabs (Furosemide) .[name]... 1 tab once daily    Metoprolol Succinate 50 Mg Xr24h-tab (Metoprolol succinate) .[name]... Take one & 1/2 tablets in the morning and one tablet in the evening  Problem # 5:  TIA (ICD-435.9) stable we'll continue Coumadin  Patient Instructions: 1)  Your physician recommends that you schedule a follow-up appointment in: 3 MONTHS WITH DR Graciela Husbands 2)  Your physician recommends that you continue on your current medications as directed. Please refer to the Current Medication list given to you today.

## 2010-12-16 NOTE — Letter (Signed)
Summary: [facility]  [facility]   Imported By: Marylou Mccoy 12/08/2010 14:54:27  _____________________________________________________________________  External Attachment:    Type:   Image     Comment:   External Document

## 2010-12-23 ENCOUNTER — Telehealth: Payer: Self-pay | Admitting: Internal Medicine

## 2010-12-30 NOTE — Progress Notes (Signed)
Summary: rx refill  Phone Note Refill Request Message from:  Patient on December 23, 2010 8:25 AM  Refills Requested: Medication #1:  METOPROLOL SUCCINATE 50 MG XR24H-TAB take one & 1/2 tablets in the morning and one tablet in the evening. please [facility] # [phone]   Method Requested: Telephone to Pharmacy Initial call taken by: Roe Coombs,  December 23, 2010 8:25 AM    Prescriptions: METOPROLOL SUCCINATE 50 MG XR24H-TAB (METOPROLOL SUCCINATE) take one & 1/2 tablets in the morning and one tablet in the evening  #45 x 10   Entered by:   Hardin Negus, RMA   Authorized by:   Nathen May, MD, [facility]   Signed by:   Hardin Negus, RMA on 12/23/2010   Method used:   Telephoned to ...       [facility] [address] [phone]* (retail)       [address]       [place], [place]  [address]       Ph: [phone]       Fax: [phone]   RxID:   [id number]  called in to [facility]

## 2011-01-02 LAB — TROPONIN I: Troponin I: 0.01 ng/mL (ref 0.00–0.06)

## 2011-01-02 LAB — URINALYSIS, ROUTINE W REFLEX MICROSCOPIC
Bilirubin Urine: NEGATIVE
Glucose, UA: NEGATIVE mg/dL
Hgb urine dipstick: NEGATIVE
Ketones, ur: NEGATIVE mg/dL
Specific Gravity, Urine: 1.015 (ref 1.005–1.030)
Urobilinogen, UA: 0.2 mg/dL (ref 0.0–1.0)

## 2011-01-02 LAB — POCT CARDIAC MARKERS
CKMB, poc: <1 ng/mL — ABNORMAL LOW (ref 1.0–8.0)
Myoglobin, poc: 122 ng/mL (ref 12–200)

## 2011-01-02 LAB — BASIC METABOLIC PANEL
BUN: 12 mg/dL (ref 6–23)
BUN: 17 mg/dL (ref 6–23)
BUN: 23 mg/dL (ref 6–23)
CO2: 22 mEq/L (ref 19–32)
Calcium: 8.8 mg/dL (ref 8.4–10.5)
Calcium: 9.1 mg/dL (ref 8.4–10.5)
Creatinine, Ser: 1.05 mg/dL (ref 0.4–1.2)
Creatinine, Ser: 1.41 mg/dL — ABNORMAL HIGH (ref 0.4–1.2)
GFR calc non Af Amer: 35 mL/min — ABNORMAL LOW (ref >60)
GFR calc non Af Amer: 49 mL/min — ABNORMAL LOW (ref >60)
GFR calc non Af Amer: 50 mL/min — ABNORMAL LOW (ref >60)
Glucose, Bld: 82 mg/dL (ref 70–99)
Glucose, Bld: 82 mg/dL (ref 70–99)
Glucose, Bld: 91 mg/dL (ref 70–99)

## 2011-01-02 LAB — CREATININE, URINE, RANDOM: Creatinine, Urine: 77.7 mg/dL

## 2011-01-02 LAB — HEPARIN INDUCED THROMBOCYTOPENIA PNL
Patient O.D.: 0.232
UFH High Dose UFH H: 4 % Release
UFH SRA Result: NEGATIVE

## 2011-01-02 LAB — FERRITIN: Ferritin: 224 ng/mL (ref 10–291)

## 2011-01-02 LAB — LIPID PANEL
LDL Cholesterol: 83 mg/dL (ref 0–99)
VLDL: 37 mg/dL (ref 0–40)

## 2011-01-02 LAB — COMPREHENSIVE METABOLIC PANEL
ALT: 12 U/L (ref 0–35)
AST: 18 U/L (ref 0–37)
Albumin: 3.2 g/dL — ABNORMAL LOW (ref 3.5–5.2)
CO2: 26 mEq/L (ref 19–32)
Creatinine, Ser: 1.94 mg/dL — ABNORMAL HIGH (ref 0.4–1.2)
GFR calc Af Amer: 30 mL/min — ABNORMAL LOW (ref >60)
Total Protein: 6.7 g/dL (ref 6.0–8.3)

## 2011-01-02 LAB — DIFFERENTIAL
Eosinophils Relative: 2 % (ref 0–5)
Monocytes Absolute: 0.3 10*3/uL (ref 0.1–1.0)
Monocytes Relative: 9 % (ref 3–12)
Neutro Abs: 2.3 10*3/uL (ref 1.7–7.7)
Neutrophils Relative %: 61 % (ref 43–77)

## 2011-01-02 LAB — CBC
Hemoglobin: 10.5 g/dL — ABNORMAL LOW (ref 12.0–15.0)
Platelets: 138 10*3/uL — ABNORMAL LOW (ref 150–400)
WBC: 3.7 10*3/uL — ABNORMAL LOW (ref 4.0–10.5)

## 2011-01-02 LAB — CARDIAC PANEL(CRET KIN+CKTOT+MB+TROPI)
CK, MB: 1.4 ng/mL (ref 0.3–4.0)
Relative Index: INVALID (ref 0.0–2.5)
Total CK: 42 U/L (ref 7–177)
Troponin I: 0.01 ng/mL (ref 0.00–0.06)
Troponin I: 0.01 ng/mL (ref 0.00–0.06)

## 2011-01-02 LAB — CK TOTAL AND CKMB (NOT AT ARMC): CK, MB: 1.1 ng/mL (ref 0.3–4.0)

## 2011-01-02 LAB — RETICULOCYTES
RBC.: 3.17 MIL/uL — ABNORMAL LOW (ref 3.87–5.11)
Retic Count, Absolute: 34.9 10*3/uL (ref 19.0–186.0)

## 2011-01-02 LAB — IRON AND TIBC
Iron: 72 ug/dL (ref 42–135)
TIBC: 207 ug/dL — ABNORMAL LOW (ref 250–470)
UIBC: 135 ug/dL

## 2011-01-02 LAB — FOLATE: Folate: 6.1 ng/mL

## 2011-01-02 LAB — VITAMIN B12: Vitamin B-12: 343 pg/mL (ref 211–911)

## 2011-03-02 ENCOUNTER — Encounter: Payer: Self-pay | Admitting: Internal Medicine

## 2011-03-02 ENCOUNTER — Encounter: Payer: Self-pay | Admitting: *Deleted

## 2011-03-04 ENCOUNTER — Encounter: Payer: Self-pay | Admitting: Internal Medicine

## 2011-03-04 ENCOUNTER — Ambulatory Visit (INDEPENDENT_AMBULATORY_CARE_PROVIDER_SITE_OTHER): Payer: Medicare Other | Admitting: Internal Medicine

## 2011-03-04 DIAGNOSIS — I4891 Unspecified atrial fibrillation: Secondary | ICD-10-CM

## 2011-03-04 DIAGNOSIS — I509 Heart failure, unspecified: Secondary | ICD-10-CM

## 2011-03-04 DIAGNOSIS — I10 Essential (primary) hypertension: Secondary | ICD-10-CM

## 2011-03-04 MED ORDER — METOPROLOL SUCCINATE ER 50 MG PO TB24: ORAL_TABLET | ORAL | Status: DC

## 2011-03-04 NOTE — Assessment & Plan Note (Signed)
Stable on current meds 

## 2011-03-04 NOTE — Assessment & Plan Note (Signed)
Reasonably well controlled.

## 2011-03-04 NOTE — Patient Instructions (Signed)
Your physician recommends that you schedule a follow-up appointment in: 3 months.  Your physician has recommended you make the following change in your medication:  1) increase Metoprolol (Toprol) to 50mg  two tablets in the morning and one tablet in the evening.

## 2011-03-04 NOTE — Progress Notes (Signed)
  HPI  Connie Weaver is a 77 y.o. female followup for paroxysmal atrial fibrillation and diastolic heart failure.    Last saw her we increased her metoprolol. She had intercurrently reverted to sinus rhythm she is doing quite well.  No she's been having some spells where she feels less energetic and this is associated but not consistently with palpitations.  SHe's had some problems with peripheral edema. h  Past Medical History  Diagnosis Date  . HTN (hypertension)   . CHF (congestive heart failure)     echo 05/2010: EF 60-65%; mild AI, mild MR; mod to severe TR; PASP 42     . Arthritis   . Atrial fibrillation     Past Surgical History  Procedure Date  . Hernia repair   . Cholecystectomy   . Status post carpal tunnel repair.     Current Outpatient Prescriptions  Medication Sig Dispense Refill  . acetaminophen (TYLENOL) 650 MG CR tablet Take 650 mg by mouth every 8 (eight) hours as needed.       . docusate sodium (COLACE) 100 MG capsule Take 100 mg by mouth as needed.        . furosemide (LASIX) 40 MG tablet Take 40 mg by mouth daily.        [name] glucosamine-chondroitin 500-400 MG tablet Take 1 tablet by mouth daily.        . magnesium oxide (MAG-OX) 400 MG tablet Take 400 mg by mouth daily.        . metoprolol (TOPROL-XL) 50 MG 24 hr tablet Take One & 1/2 Tablets in The Morning and One Tablet in The Evening       . omeprazole (PRILOSEC) 40 MG capsule Take 40 mg by mouth daily.        . vitamin C (ASCORBIC ACID) 500 MG tablet Take 500 mg by mouth daily.        [name] warfarin (COUMADIN) 5 MG tablet Take 5 mg by mouth daily.        . Zinc 25 MG TABS 1 tab po qd       . ZINC ACETATE, ORAL, PO 1 tab po qd         No Known Allergies  Review of Systems negative except from HPI and PMH  Physical Exam Well developed and OB 7 American femalein no acute distress HENT normal E scleral and icterus clear Neck Supple  Clear to ausculation Regular rate and rhythm, no murmurs gallops or  rub Soft with active bowel sounds No clubbing cyanosis trace edema Alert and oriented, grossly normal motor and sensory function Skin Warm and Dry  ECG atrial fibrillation at 84 Intervals-/0.09/0.37 Axis of -22  Assessment and  Plan

## 2011-03-04 NOTE — Assessment & Plan Note (Signed)
The patient has paroxysmal atrial fibrillation. By the end of the examination today she is back in sinus rhythm. Her heart rate was a little fast in atrial fibrillation and she has had some tachycardia palpitations. We discussed 3 approaches. Further be to continue our current course, the second was to increase her metoprolol, and a third was to consider amiodarone. We discussed potential side effects as well as the possibility that it would require pacemaker implantation.  By the end of the visit she had reverted to sinus rhythm. Is my thought that we should go ahead and increase the metoprolol. In the event that she continues to have symptoms, however, prior to proceeding further, we would undertake an event recorder to try and correlate her symptoms with either fast heart rates were irregular rhythms

## 2011-04-30 ENCOUNTER — Encounter: Payer: Self-pay | Admitting: Family Medicine

## 2011-05-25 ENCOUNTER — Telehealth: Payer: Self-pay | Admitting: Internal Medicine

## 2011-05-25 NOTE — Telephone Encounter (Signed)
SPOKE WITH DAUGHTER  MOTHER HAD EPISODE ON SAT  PASSED  OUT  WAS LETHARGIC AND DROOLING  PER DAUGHTERS FAMILY MEMBERS  EMS EVALUATED  B/P 110/60 AND HR  WAS 55 PT HAD NOT TAKEN MEDS  OR EATEN  DAUGHTER HAS HELD METOPROLOL  INSTRUCTED TO CONT TO HOLD AND MONITOR  B/P AND HR IF EITHER INCREASES INSTRUCTED TO CALL OFF BACK OFFERED APPT TOM  WITH MICHELE LENZE PAC DECLINED AT THIS TIME DUE TO TRANSPORTATION ISSUES  APPT MADE WITH SCOTT WEAVER [facility]  FOR THE FOLLOWING WEEK./ CY

## 2011-05-25 NOTE — Telephone Encounter (Signed)
Returning call. Unable to reach either message nurse, unable to locate anyone who called pt. Please return call.

## 2011-05-25 NOTE — Telephone Encounter (Signed)
Pt had a syncope spell this weekend, heart rate was low EMS was called and daughter stopped metoprolol because it makes the heart rate low and since they stopped it pt has been ok. You can also reach the daughter on her cell # [phone]

## 2011-06-03 ENCOUNTER — Ambulatory Visit: Payer: Medicare Other | Admitting: Physician Assistant

## 2011-06-03 NOTE — Telephone Encounter (Signed)
The patient cancelled her appointment with Tereso Newcomer, PA today and r/s to 8/23 with Dr. Graciela Husbands.

## 2011-06-11 ENCOUNTER — Encounter: Payer: Self-pay | Admitting: Internal Medicine

## 2011-06-11 ENCOUNTER — Ambulatory Visit (INDEPENDENT_AMBULATORY_CARE_PROVIDER_SITE_OTHER): Payer: Medicare Other | Admitting: Internal Medicine

## 2011-06-11 VITALS — BP 128/71 | HR 83 | Ht 61.0 in | Wt 234.0 lb

## 2011-06-11 DIAGNOSIS — I509 Heart failure, unspecified: Secondary | ICD-10-CM

## 2011-06-11 DIAGNOSIS — I4891 Unspecified atrial fibrillation: Secondary | ICD-10-CM

## 2011-06-11 DIAGNOSIS — I5032 Chronic diastolic (congestive) heart failure: Secondary | ICD-10-CM

## 2011-06-11 DIAGNOSIS — R55 Syncope and collapse: Secondary | ICD-10-CM

## 2011-06-11 NOTE — Assessment & Plan Note (Signed)
This was probably vagally mediated but could be related to tachybrady with termiantion pauses  Which ever it is possible that it could be ameliorated by pacing

## 2011-06-11 NOTE — Telephone Encounter (Signed)
Patient car broke down and she did not think she would make it for her appointment. She did make it to see Dr. Graciela Husbands.

## 2011-06-11 NOTE — Patient Instructions (Addendum)
Dr Odessa Fleming nurse, Sherri Rad, will be in touch with you to schedule the pacemaker.  Your physician has recommended you make the following change in your medication:  1) Restart metoprolol succ 100mg  one tablet by mouth once daily.

## 2011-06-11 NOTE — Progress Notes (Signed)
   HPI  Connie Weaver is a 77 y.o. female followup for paroxysmal atrial fibrillation and diastolic heart failure.    We had previously increased her metoprolol but she has been in and out of sinus;  seh has struggled with AF and RVR and then had syncope with documented relative brady and hypotension.  Her metoprolol was discontinued    No she's been having some spells where she feels less energetic and this is associated but not consistently with palpitations.  SHe's had some problems with peripheral edema.   Past Medical History  Diagnosis Date  . HTN (hypertension)   . CHF (congestive heart failure)     echo 05/2010: EF 60-65%; mild AI, mild MR; mod to severe TR; PASP 42     . Arthritis   . Atrial fibrillation     Past Surgical History  Procedure Date  . Hernia repair   . Cholecystectomy   . Status post carpal tunnel repair.     Current Outpatient Prescriptions  Medication Sig Dispense Refill  . acetaminophen (TYLENOL) 650 MG CR tablet Take 650 mg by mouth every 8 (eight) hours as needed.       . docusate sodium (COLACE) 100 MG capsule Take 100 mg by mouth as needed.        . furosemide (LASIX) 40 MG tablet Take 40 mg by mouth daily.        [name] glucosamine-chondroitin 500-400 MG tablet Take 1 tablet by mouth daily.        . magnesium oxide (MAG-OX) 400 MG tablet Take 400 mg by mouth daily.        . metoprolol (TOPROL-XL) 50 MG 24 hr tablet Take One & 1/2 Tablets in The Morning and One Tablet in The Evening       . omeprazole (PRILOSEC) 40 MG capsule Take 40 mg by mouth daily.        . vitamin C (ASCORBIC ACID) 500 MG tablet Take 500 mg by mouth daily.        [name] warfarin (COUMADIN) 5 MG tablet Take 5 mg by mouth daily.        . Zinc 25 MG TABS 1 tab po qd       . ZINC ACETATE, ORAL, PO 1 tab po qd         No Known Allergies  Review of Systems negative except from HPI and PMH  Physical Exam Well developed and OB 7 American femalein no acute distress HENT normal E  scleral and icterus clear Neck Supple  Clear to ausculation Regular rate and rhythm, no murmurs gallops or rub Soft with active bowel sounds No clubbing cyanosis trace edema Alert and oriented, grossly normal motor and sensory function Skin Warm and Dry  ECG sinus rhyhtm aIntervals .15/.09/0.37 Axis of -22  Assessment and  Plan

## 2011-06-11 NOTE — Assessment & Plan Note (Signed)
Paroxsmal, will continue current course. We have discussed the implications of tachybradycardia syndrome and implications of the recent discontinuation of her beta blocker on atrial fibrillation rates. We discussed the role of pacing; this is been on the table for about a year and I think we should probably proceed given the tachybradycardia component. In addition given her recent episode of syncope, or recent data to suggest that some of these patients may also benefit from backup bradycardia pacing. She and her daughter both understand that the syncope may not be totally controlled.  They're willing to proceed

## 2011-06-11 NOTE — Assessment & Plan Note (Signed)
Stable will continue currene diuretics and add back her am toprol

## 2011-06-11 NOTE — Telephone Encounter (Signed)
Daughter wants to know should pt re-started back on metoprolol .

## 2011-06-25 ENCOUNTER — Telehealth: Payer: Self-pay | Admitting: *Deleted

## 2011-06-25 ENCOUNTER — Encounter: Payer: Self-pay | Admitting: *Deleted

## 2011-06-25 DIAGNOSIS — I495 Sick sinus syndrome: Secondary | ICD-10-CM

## 2011-06-25 DIAGNOSIS — Z01810 Encounter for preprocedural cardiovascular examination: Secondary | ICD-10-CM

## 2011-06-25 NOTE — Telephone Encounter (Signed)
I spoke with the patient's daughter, Arna Medici, and discussed instructions for the patient's PPM implant on 07/10/11 @ 11:00am. She will come on 9/17 for labs.

## 2011-07-06 ENCOUNTER — Other Ambulatory Visit (INDEPENDENT_AMBULATORY_CARE_PROVIDER_SITE_OTHER): Payer: Medicare Other | Admitting: *Deleted

## 2011-07-06 DIAGNOSIS — I495 Sick sinus syndrome: Secondary | ICD-10-CM

## 2011-07-06 DIAGNOSIS — Z01810 Encounter for preprocedural cardiovascular examination: Secondary | ICD-10-CM

## 2011-07-07 LAB — CBC WITH DIFFERENTIAL/PLATELET
Basophils Absolute: 0 10*3/uL (ref 0.0–0.1)
Eosinophils Absolute: 0.1 10*3/uL (ref 0.0–0.7)
Hemoglobin: 12.9 g/dL (ref 12.0–15.0)
Lymphocytes Relative: 36 % (ref 12.0–46.0)
MCHC: 32.8 g/dL (ref 30.0–36.0)
Monocytes Relative: 7.3 % (ref 3.0–12.0)
Neutro Abs: 2.8 10*3/uL (ref 1.4–7.7)
Neutrophils Relative %: 54.1 % (ref 43.0–77.0)
RBC: 3.98 Mil/uL (ref 3.87–5.11)
RDW: 16.8 % — ABNORMAL HIGH (ref 11.5–14.6)

## 2011-07-07 LAB — BASIC METABOLIC PANEL
Calcium: 9.9 mg/dL (ref 8.4–10.5)
Creatinine, Ser: 1.1 mg/dL (ref 0.4–1.2)
GFR: 63.19 mL/min (ref >60.00)
Glucose, Bld: 114 mg/dL — ABNORMAL HIGH (ref 70–99)
Sodium: 142 mEq/L (ref 135–145)

## 2011-07-07 LAB — PROTIME-INR: INR: 2.8 ratio — ABNORMAL HIGH (ref 0.8–1.0)

## 2011-07-10 ENCOUNTER — Ambulatory Visit (HOSPITAL_COMMUNITY): Payer: Medicare Other

## 2011-07-10 ENCOUNTER — Inpatient Hospital Stay (HOSPITAL_COMMUNITY)
Admission: RE | Admit: 2011-07-10 | Discharge: 2011-07-12 | DRG: 243 | Disposition: A | Discharge status: Home / Self Care | Payer: Medicare Other | Source: Ambulatory Visit | Attending: Internal Medicine | Admitting: Internal Medicine

## 2011-07-10 DIAGNOSIS — D649 Anemia, unspecified: Secondary | ICD-10-CM | POA: Diagnosis present

## 2011-07-10 DIAGNOSIS — Z8673 Personal history of transient ischemic attack (TIA), and cerebral infarction without residual deficits: Secondary | ICD-10-CM

## 2011-07-10 DIAGNOSIS — I495 Sick sinus syndrome: Secondary | ICD-10-CM

## 2011-07-10 DIAGNOSIS — M1990 Unspecified osteoarthritis, unspecified site: Secondary | ICD-10-CM | POA: Diagnosis present

## 2011-07-10 DIAGNOSIS — I5032 Chronic diastolic (congestive) heart failure: Secondary | ICD-10-CM | POA: Diagnosis present

## 2011-07-10 DIAGNOSIS — Q249 Congenital malformation of heart, unspecified: Secondary | ICD-10-CM

## 2011-07-10 DIAGNOSIS — E669 Obesity, unspecified: Secondary | ICD-10-CM | POA: Diagnosis present

## 2011-07-10 DIAGNOSIS — I509 Heart failure, unspecified: Secondary | ICD-10-CM | POA: Diagnosis present

## 2011-07-10 DIAGNOSIS — I10 Essential (primary) hypertension: Secondary | ICD-10-CM | POA: Diagnosis present

## 2011-07-10 LAB — BASIC METABOLIC PANEL
BUN: 14 mg/dL (ref 6–23)
Creatinine, Ser: 0.88 mg/dL (ref 0.50–1.10)
GFR calc non Af Amer: >60 mL/min (ref >60)
Glucose, Bld: 90 mg/dL (ref 70–99)
Potassium: 4.4 mEq/L (ref 3.5–5.1)

## 2011-07-10 LAB — PROTIME-INR: Prothrombin Time: 25.1 seconds — ABNORMAL HIGH (ref 11.6–15.2)

## 2011-07-11 ENCOUNTER — Ambulatory Visit (HOSPITAL_COMMUNITY): Payer: Medicare Other

## 2011-07-11 LAB — PROTIME-INR
INR: 2.38 — ABNORMAL HIGH (ref 0.00–1.49)
Prothrombin Time: 26.4 seconds — ABNORMAL HIGH (ref 11.6–15.2)

## 2011-07-12 LAB — CBC
HCT: 33.3 % — ABNORMAL LOW (ref 36.0–46.0)
Hemoglobin: 10.8 g/dL — ABNORMAL LOW (ref 12.0–15.0)
MCH: 31.2 pg (ref 26.0–34.0)
MCHC: 32.4 g/dL (ref 30.0–36.0)
MCV: 96.2 fL (ref 78.0–100.0)
Platelets: 117 K/uL — ABNORMAL LOW (ref 150–400)
RBC: 3.46 MIL/uL — ABNORMAL LOW (ref 3.87–5.11)
RDW: 15.6 % — ABNORMAL HIGH (ref 11.5–15.5)
WBC: 5.1 K/uL (ref 4.0–10.5)

## 2011-07-12 LAB — BASIC METABOLIC PANEL WITH GFR
BUN: 11 mg/dL (ref 6–23)
CO2: 28 meq/L (ref 19–32)
Calcium: 9.6 mg/dL (ref 8.4–10.5)
Chloride: 102 meq/L (ref 96–112)
Creatinine, Ser: 0.82 mg/dL (ref 0.50–1.10)
GFR calc Af Amer: >60 mL/min
GFR calc non Af Amer: >60 mL/min
Glucose, Bld: 87 mg/dL (ref 70–99)
Potassium: 4.2 meq/L (ref 3.5–5.1)
Sodium: 139 meq/L (ref 135–145)

## 2011-07-13 NOTE — Op Note (Signed)
  NAMESHANIRA, TINE NO.:  [id number]  MEDICAL RECORD NO.:  [id number]  LOCATION:  2507                         FACILITY:  [facility]  PHYSICIAN:  Duke Salvia, MD, FACCDATE OF BIRTH:  05-15-1923  DATE OF PROCEDURE:  07/10/2011 DATE OF DISCHARGE:                              OPERATIVE REPORT   PREOPERATIVE DIAGNOSES:  Poor venous access, tachybrady syndrome.  POSTOPERATIVE DIAGNOSES:  Preoperative PICC line placement, dual-chamber pacemaker implantation.  Following obtaining informed consent, the patient was brought to the electrophysiology laboratory and placed on the fluoroscopic table in supine position.  After routine prep and drape of the left upper chest, lidocaine was infiltrated in the prepectoral subclavicular region. Incision was made and carried down to layer of the prepectoral fascia. Using electrocautery and sharp dissection, a pocket was formed similarly.  Hemostasis was obtained.  Thereafter, attention was turned to gaining access to the external thoracic left subclavian vein which was accomplished without difficulty and without the aspiration of air or puncture of the artery.  Two separate venipunctures were accomplished.  Guidewires were placed and retained and sequentially, 7-French sheaths were placed through which were passed a St. Jude passive fixation ventricular lead model 1948, serial number [id number], and a St. Jude active fixation atrial lead model 2088, serial number [id number].  Under fluoroscopic guidance, these were manipulated to the right ventricular apex and the right atrial appendage respectively where the bipolar R-wave was 10 with a pace impedance of 765 a threshold of 0.9 at 0.5.  Current of threshold was 0.8 mA.  There was no diaphragmatic pacing at 10 volts.  The bipolar fibrillation wave was 1.8 with a pace impedance of 434, current of injury was brisk. These leads were secured to the prepectoral fascia and then attached  to a St. Jude Accent DR pulse generator model [id number], serial number [id number].  Ventricular pacing and P synchronous pacing with mode switch were identified.  The pocket was copiously irrigated with antibiotic containing saline solution.  Hemostasis was assured.  Leads and the pulse generator were placed in the pocket, secured to the prepectoral fascia.  Surgicel was placed on the posterior anterior cephalad and lateral aspects of the pocket.  The wound was then closed in three layers in normal fashion.  The wound was washed, dried, and a benzoin and Steri-Strip dressing was applied.  Needle counts, sponge counts and instrument counts were correct at the end of the procedure according to the staff.  The patient tolerated the procedure without apparent complication.     Duke Salvia, MD, [facility]     SCK/MEDQ  D:  07/10/2011  T:  07/11/2011  Job:  [id number]  Electronically Signed by Sherryl Manges MD [facility] on 07/13/2011 04:06:04 PM

## 2011-07-22 ENCOUNTER — Ambulatory Visit: Payer: Medicare Other | Admitting: *Deleted

## 2011-07-22 ENCOUNTER — Encounter: Payer: Self-pay | Admitting: Internal Medicine

## 2011-07-22 ENCOUNTER — Ambulatory Visit (INDEPENDENT_AMBULATORY_CARE_PROVIDER_SITE_OTHER): Payer: Medicare Other | Admitting: *Deleted

## 2011-07-22 DIAGNOSIS — I4891 Unspecified atrial fibrillation: Secondary | ICD-10-CM

## 2011-07-22 LAB — PACEMAKER DEVICE OBSERVATION
AL THRESHOLD: 0.75 V
ATRIAL PACING PM: 32
BAMS-0001: 150 {beats}/min
BAMS-0003: 60 {beats}/min
DEVICE MODEL PM: 7281691
RV LEAD THRESHOLD: 0.5 V

## 2011-07-22 NOTE — Progress Notes (Signed)
Pacer check in clinic  

## 2011-08-14 NOTE — Discharge Summary (Signed)
NAMEAPARNA, VANDERWEELE NO.:  [id number]  MEDICAL RECORD NO.:  [id number]  LOCATION:  3715                         FACILITY:  [facility]  PHYSICIAN:  Duke Salvia, MD, FACCDATE OF BIRTH:  1923/10/08  DATE OF ADMISSION:  07/10/2011 DATE OF DISCHARGE:  07/12/2011                              DISCHARGE SUMMARY   PROCEDURES: 1. Peripherally inserted central catheter line placement. 2. Insertion of a St. Jude Accent DR pulse generator dual lead     permanent pacemaker. 3. Two-view chest x-ray  PRIMARY FINAL DISCHARGE DIAGNOSIS:  Paroxysmal atrial fibrillation with tachybrady syndrome.  SECONDARY DIAGNOSIS: 1. Chronic diastolic congestive heart failure. 2. Preserved left ventricular function with an ejection fraction of 60-     65%, mild aortic insufficiency and mild mitral regurgitation with     moderate to severe tricuspid regurgitation by echocardiogram in     August 2011. 3. Osteoarthritis. 4. Hypertension. 5. Status post hernia repair, cholecystectomy, and carpal tunnel     repair. 6. Obesity. 7. History of transient ischemic attack. 8. Anemia. 9. Right frontal lobe arachnoid cyst, stable by CT in 2011. 10.History of syncope. 11.History of renal failure, felt secondary to Lasix and spirolactone.  TIME AT DISCHARGE:  36 minutes.  HOSPITAL COURSE:  Ms. Steveson is an 77 year old female with a history of tachybrady syndrome.  She was evaluated by Dr. Graciela Husbands and pacemaker was recommended.  She came to the hospital for the procedure on July 10, 2011.  Ms. Fregeau had a dual lead permanent pacemaker described above inserted without complications.  A postprocedure chest x-ray showed pacemaker placement without pneumothorax or apparent complications.  However, she complained of weakness and had problems with nausea and vomiting, so she was held overnight.  She was in and out of atrial fibrillation, but on a followup device check the device was functioning  well.  On July 12, 2011, Ms. Evenson symptoms had resolved.  She was evaluated by Dr. Graciela Husbands and considered stable for discharge, to follow up as an outpatient.  DISCHARGE INSTRUCTIONS: 1. Her activity level is to be increased gradually per the discharge     instruction sheet. 2. She is encouraged to stick to a low-sodium, heart-healthy diet. 3. She is to call our office for problems with the incision. 4. She is to follow up in our office with a wound check and follow up     with Dr. Graciela Husbands and we will contact her for these. 5. She is to follow up with Dr. Philip Aspen at [facility] [place] needed or as     scheduled. 6. She is to get a Coumadin check next week.  DISCHARGE MEDICATIONS: 1. Tylenol 325 mg 2 tablets q.8 h. p.r.n. 2. Mag-Ox 400 mg a day. 3. Coumadin 5 mg a day. 4. Toprol-XL 100 mg a day. 5. Colace 1 capsule nightly p.r.n. 6. Lasix 40 mg 1-1/2 tablets daily. 7. Glucosamine daily. 8. Prilosec 40 mg a day. 9. Zinc OTC 25 mg a day. 10.Vitamin C 500 mg a day.     Theodore Demark, PA-C   ______________________________ Duke Salvia, MD, [facility]    RB/MEDQ  D:  07/12/2011  T:  07/12/2011  Job:  [id number]  cc:   Willaim Rayas. Philip Aspen, MD  Electronically Signed by Theodore Demark PA-C on 07/27/2011 06:48:09 AM Electronically Signed by Sherryl Manges MD FACC on 08/14/2011 07:23:07 AM

## 2011-09-16 ENCOUNTER — Encounter: Payer: Self-pay | Admitting: Internal Medicine

## 2011-09-16 ENCOUNTER — Ambulatory Visit (INDEPENDENT_AMBULATORY_CARE_PROVIDER_SITE_OTHER): Payer: Medicare Other | Admitting: *Deleted

## 2011-09-16 ENCOUNTER — Encounter: Payer: Medicare Other | Admitting: Internal Medicine

## 2011-09-16 DIAGNOSIS — I495 Sick sinus syndrome: Secondary | ICD-10-CM

## 2011-09-16 LAB — PACEMAKER DEVICE OBSERVATION
AL AMPLITUDE: 1.2 mv
AL IMPEDENCE PM: 525 Ohm
ATRIAL PACING PM: 46
BAMS-0003: 60 {beats}/min
BRDY-0004RV: 110 {beats}/min
RV LEAD AMPLITUDE: 12 mv
RV LEAD IMPEDENCE PM: 762.5 Ohm
RV LEAD THRESHOLD: 0.5 V

## 2011-09-16 NOTE — Progress Notes (Signed)
PPM check 

## 2011-11-03 ENCOUNTER — Encounter: Payer: Self-pay | Admitting: Internal Medicine

## 2011-11-03 ENCOUNTER — Ambulatory Visit (INDEPENDENT_AMBULATORY_CARE_PROVIDER_SITE_OTHER): Payer: Medicare Other | Admitting: Internal Medicine

## 2011-11-03 VITALS — BP 126/72 | HR 60 | Ht 64.0 in | Wt 238.0 lb

## 2011-11-03 DIAGNOSIS — Z950 Presence of cardiac pacemaker: Secondary | ICD-10-CM | POA: Insufficient documentation

## 2011-11-03 DIAGNOSIS — R609 Edema, unspecified: Secondary | ICD-10-CM

## 2011-11-03 DIAGNOSIS — I5032 Chronic diastolic (congestive) heart failure: Secondary | ICD-10-CM

## 2011-11-03 DIAGNOSIS — I509 Heart failure, unspecified: Secondary | ICD-10-CM

## 2011-11-03 DIAGNOSIS — R55 Syncope and collapse: Secondary | ICD-10-CM

## 2011-11-03 DIAGNOSIS — I4891 Unspecified atrial fibrillation: Secondary | ICD-10-CM

## 2011-11-03 LAB — PACEMAKER DEVICE OBSERVATION
AL IMPEDENCE PM: 460 Ohm
AL THRESHOLD: 0.625 V
RV LEAD IMPEDENCE PM: 730 Ohm

## 2011-11-03 NOTE — Assessment & Plan Note (Signed)
She is tolerating her warfarin. During atrial fibrillation her rates are well controlled.

## 2011-11-03 NOTE — Assessment & Plan Note (Signed)
As above.

## 2011-11-03 NOTE — Patient Instructions (Signed)
Your physician wants you to follow-up in: 6 months with Dr. Klein. You will receive a reminder letter in the mail two months in advance. If you don't receive a letter, please call our office to schedule the follow-up appointment.  Your physician recommends that you continue on your current medications as directed. Please refer to the Current Medication list given to you today.  

## 2011-11-03 NOTE — Assessment & Plan Note (Signed)
No recurrent syncope 

## 2011-11-03 NOTE — Assessment & Plan Note (Signed)
There may be a rate limiting issue here as she has chronotropic incompetence. We will activate rate response and see if this doesn't improve her dyspnea. In the event that it does not, we'll increase her Lasix from once a day to every other day alternating for 10 days and see how she does. She has a little bit of evidence of edema today

## 2011-11-03 NOTE — Progress Notes (Signed)
  HPI  Makenzye Troutman is a 78 y.o. female Seen in followup for paroxysmal atrial fibrillation and diastolic heart failure and bradycardia for which he underwent pacemaker implantation September 2012  She continues to have some shortness of breath. She had hoped that this would go away with her pacemaker.   SHe's had some problems with peripheral edema.    Past Medical History  Diagnosis Date  . HTN (hypertension)   . CHF (congestive heart failure)     echo 05/2010: EF 60-65%; mild AI, mild MR; mod to severe TR; PASP 42     . Arthritis   . Atrial fibrillation     Past Surgical History  Procedure Date  . Hernia repair   . Cholecystectomy   . Status post carpal tunnel repair.     Current Outpatient Prescriptions  Medication Sig Dispense Refill  . acetaminophen (TYLENOL) 650 MG CR tablet Take 650 mg by mouth every 8 (eight) hours as needed.       . docusate sodium (COLACE) 100 MG capsule Take 100 mg by mouth as needed.        . furosemide (LASIX) 40 MG tablet Take 60 mg by mouth daily.       [name] glucosamine-chondroitin 500-400 MG tablet Take 1 tablet by mouth daily.        . magnesium oxide (MAG-OX) 400 MG tablet Take 400 mg by mouth daily.        . metoprolol (TOPROL-XL) 100 MG 24 hr tablet Take 1 tablet (100 mg total) by mouth daily.      [name] omeprazole (PRILOSEC) 40 MG capsule Take 40 mg by mouth daily.        . vitamin C (ASCORBIC ACID) 500 MG tablet Take 500 mg by mouth daily.        [name] warfarin (COUMADIN) 5 MG tablet Take 5 mg by mouth daily.        . Zinc 25 MG TABS 1 tab po qd         Allergies  Allergen Reactions  . Acetaminophen   . Aggrenox (Aspirin-Dipyridamole)   . Ginkgo Biloba   . Lisinopril   . Naproxen     Review of Systems negative except from HPI and PMH  Physical Exam BP 126/72  Pulse 60  Ht 5\' 4"  (1.626 m)  Wt 238 lb (107.956 kg)  BMI 40.85 kg/m2 Well developed and well nourished in no acute distress HENT normal E scleral and icterus clear Neck  Supple JVP 8-9 cm  t; carotids brisk and full Clear to ausculation Regular rate and rhythm, no murmurs gallops or rub Soft with active bowel sounds No clubbing cyanosis 1+ Edema Alert and oriented, grossly normal motor and sensory function Skin Warm and Dry   Assessment and  Plan

## 2011-11-03 NOTE — Assessment & Plan Note (Signed)
The patient's device was interrogated and the information was fully reviewed.  The device was reprogrammed to maximize longevity and rate response was activated

## 2011-12-06 IMAGING — XA IR CENTRAL LINE *R*
1 series · 2 of 2 positions shown · non-contrast
Comparison: none

CLINICAL DATA: Arrhythmia, preop planning

PICC PLACEMENT WITH ULTRASOUND AND FLUOROSCOPY
TECHNIQUE: After written informed consent was obtained, patient was
placed in the supine position on angiographic table. Patency of the
right brachial vein was confirmed with ultrasound with image
documentation. An appropriate skin site was determined. Skin site
was marked. Region was prepped using maximum barrier technique
including cap and mask, sterile gown, sterile gloves, large sterile
sheet, and Chlorhexidine   as cutaneous antisepsis.  The region was
infiltrated locally with 1% lidocaine.   Under real-time ultrasound
guidance, the right brachial vein was accessed with a 21 gauge
micropuncture needle; the needle tip within the vein was confirmed
with ultrasound image documentation.   Needle exchanged over a 018
guidewire for a peel-away sheath, through which a 5-French double
-lumen power injectable PICC trimmed to 43cm was advanced,
positioned with its tip near the cavoatrial junction.  Spot chest
radiograph confirms appropriate catheter position.  Catheter was
flushed per protocol and secured externally with 0-Prolene sutures.
The patient tolerated procedure well, with no immediate
complication.

[Series 1: run · 2 of 2 slices shown]
[im 1/2]
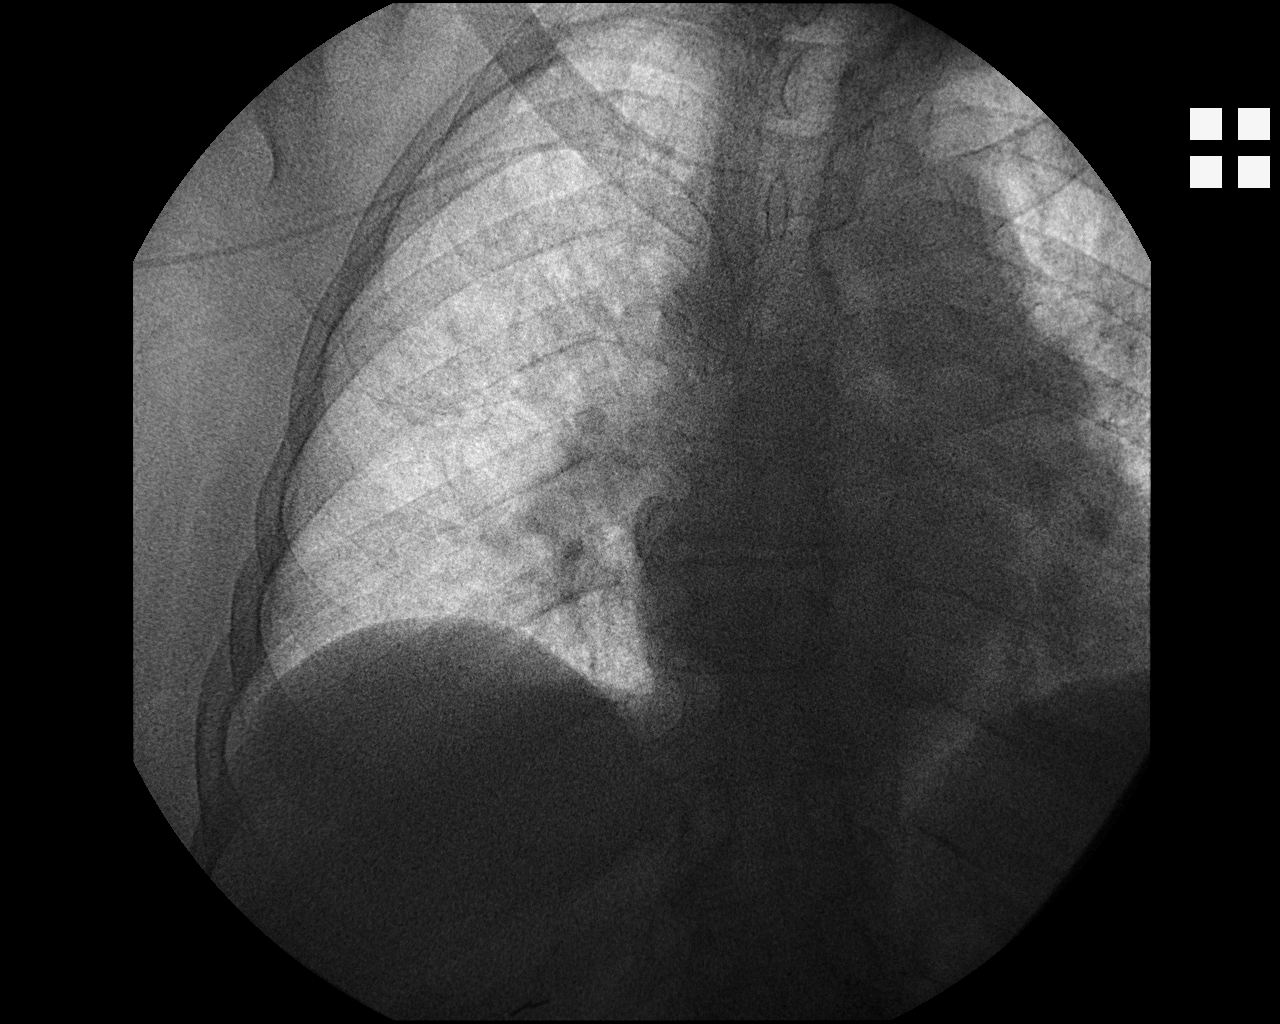
[im 2/2]
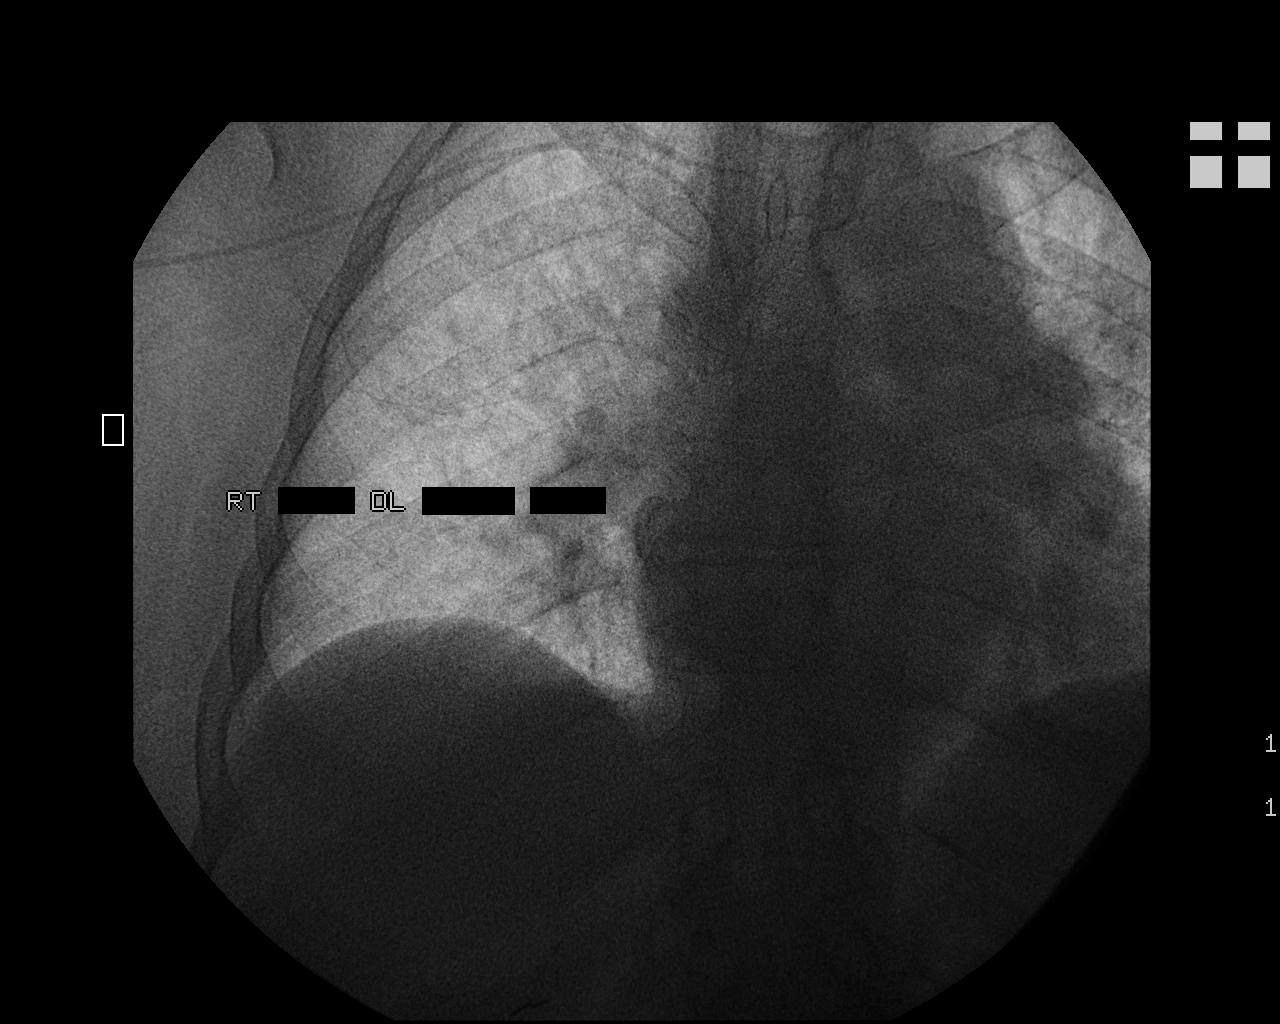

[2 of 2 positions shown; findings below may reference images not displayed]

IMPRESSION: Technically successful five French double lumen power injectable
PICC placement

## 2011-12-06 IMAGING — CR DG CHEST 2V
2 series · 2 of 2 positions shown · non-contrast
Comparison: Chest x-ray 06/03/2010.

CLINICAL DATA: Pre pacemaker implantation.

CHEST - 2 VIEW

[view not recorded (1 of 2)]
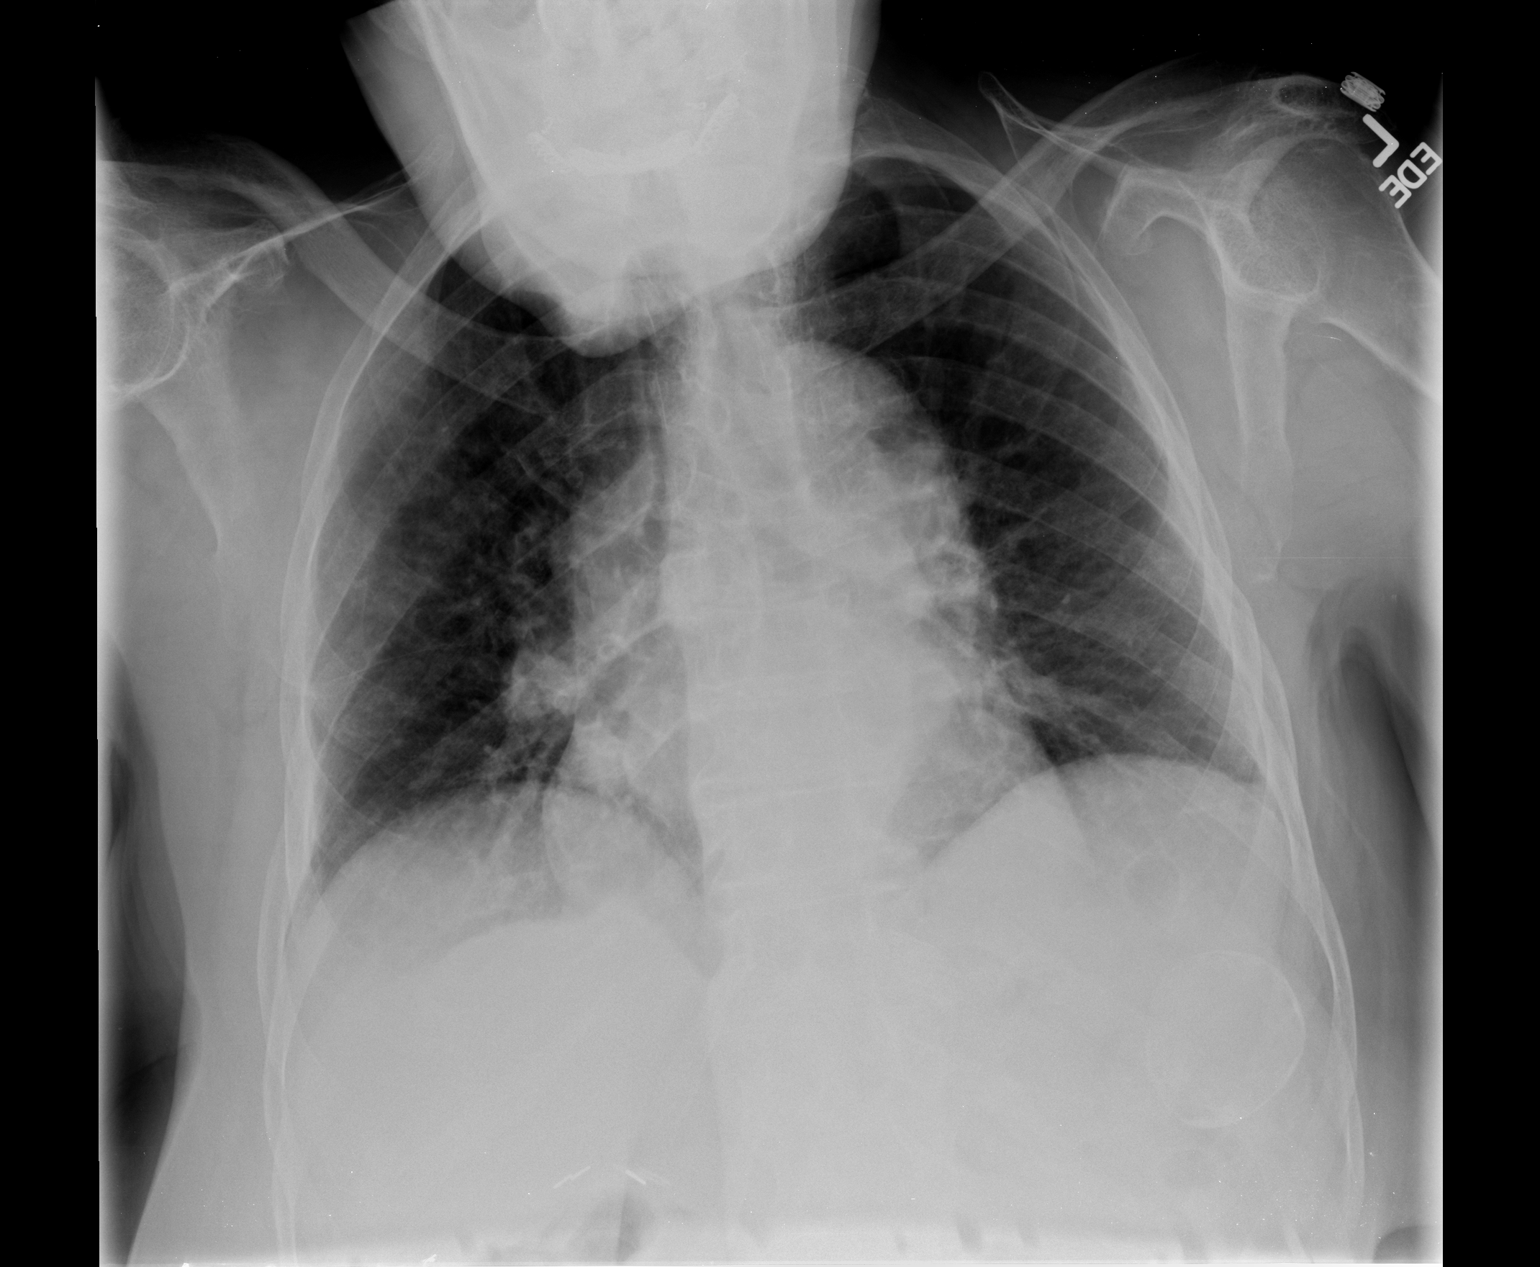

[view not recorded (2 of 2)]
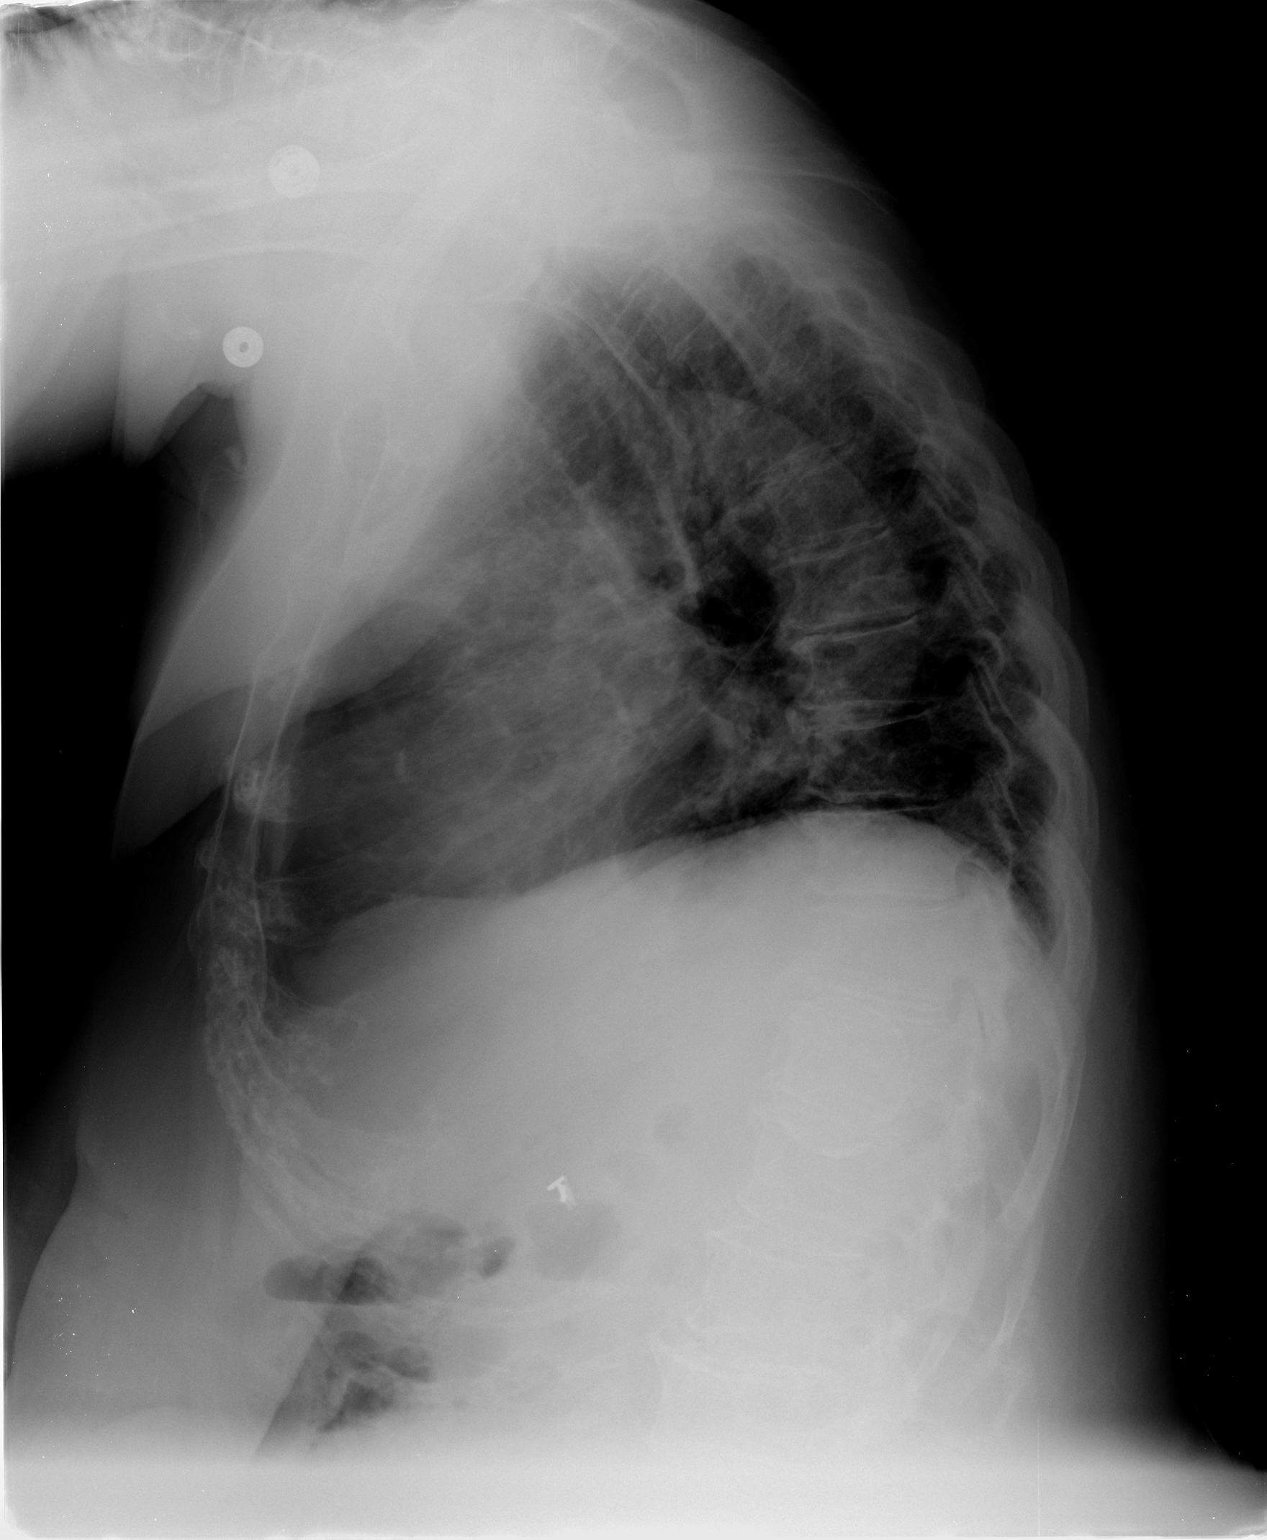

[2 of 2 positions shown; findings below may reference images not displayed]

FINDINGS: The cardiac silhouette, mediastinal and hilar contours
are within normal limits for age and stable.  Low lung volumes with
vascular crowding and bibasilar atelectasis are noted.  No
infiltrates, edema or effusions.  Stable calcified density in the
left lower quadrant is likely a calcified splenic cyst.
IMPRESSION: 1.  Low lung volumes with vascular crowding and atelectasis.
2.  No infiltrates, edema or effusions.

## 2012-04-04 ENCOUNTER — Other Ambulatory Visit: Payer: Self-pay | Admitting: Cardiology

## 2012-04-04 DIAGNOSIS — I4891 Unspecified atrial fibrillation: Secondary | ICD-10-CM

## 2012-04-04 MED ORDER — METOPROLOL SUCCINATE ER 100 MG PO TB24: 100.0000 mg | ORAL_TABLET | Freq: Every day | ORAL | Status: DC

## 2012-06-15 ENCOUNTER — Other Ambulatory Visit: Payer: Self-pay | Admitting: Cardiology

## 2012-06-15 DIAGNOSIS — I4891 Unspecified atrial fibrillation: Secondary | ICD-10-CM

## 2012-06-16 MED ORDER — METOPROLOL SUCCINATE ER 100 MG PO TB24
100.0000 mg | ORAL_TABLET | Freq: Every day | ORAL | Status: DC
Start: 2012-06-15 — End: 2012-08-12

## 2012-07-01 ENCOUNTER — Ambulatory Visit (INDEPENDENT_AMBULATORY_CARE_PROVIDER_SITE_OTHER): Payer: Medicare Other | Admitting: Internal Medicine

## 2012-07-01 ENCOUNTER — Encounter: Payer: Self-pay | Admitting: Internal Medicine

## 2012-07-01 VITALS — BP 127/55 | HR 68 | Ht 64.0 in | Wt 218.0 lb

## 2012-07-01 DIAGNOSIS — G459 Transient cerebral ischemic attack, unspecified: Secondary | ICD-10-CM

## 2012-07-01 DIAGNOSIS — I4891 Unspecified atrial fibrillation: Secondary | ICD-10-CM

## 2012-07-01 DIAGNOSIS — Z950 Presence of cardiac pacemaker: Secondary | ICD-10-CM

## 2012-07-01 LAB — PACEMAKER DEVICE OBSERVATION
AL IMPEDENCE PM: 525 Ohm
AL THRESHOLD: 0.625 V
ATRIAL PACING PM: 37
BAMS-0001: 150 {beats}/min
BAMS-0003: 60 {beats}/min
DEVICE MODEL PM: 7281691
RV LEAD THRESHOLD: 0.625 V

## 2012-07-01 NOTE — Assessment & Plan Note (Signed)
paroxzmal af with adequate rate control

## 2012-07-01 NOTE — Patient Instructions (Signed)

## 2012-07-01 NOTE — Assessment & Plan Note (Signed)
The patient's device was interrogated.  The information was reviewed. No changes were made in the programming.    

## 2012-07-01 NOTE — Assessment & Plan Note (Signed)
iintercurrent tia when her coumadin/INR were low  she has recovered well.

## 2012-07-01 NOTE — Progress Notes (Signed)
  [facility] Patient Care Team: [name], MD as PCP - General (Family Medicine)   HPI  Connie Weaver is a 78 y.o. female Seen in followup for paroxysmal atrial fibrillation and diastolic heart failure and bradycardia for which he underwent pacemaker implantation September 2012  She continues to have some shortness of breath. She had hoped that this would go away with her pacemaker.   It is her impression at her daughter's impression that she is better post pacing  Past Medical History  Diagnosis Date  . HTN (hypertension)   . Chronic diastolic heart failure     echo 05/2010: EF 60-65%; mild AI, mild MR; mod to severe TR; PASP 42     . Arthritis   . Atrial fibrillation   . Pacemaker -St. Jude   . Sinus bradycardia   . Syncope     Past Surgical History  Procedure Date  . Hernia repair   . Cholecystectomy   . Status post carpal tunnel repair.     Current Outpatient Prescriptions  Medication Sig Dispense Refill  . acetaminophen (TYLENOL) 650 MG CR tablet Take 650 mg by mouth every 8 (eight) hours as needed.       [name] atorvastatin (LIPITOR) 80 MG tablet Take 80 mg by mouth daily.      [name] docusate sodium (COLACE) 100 MG capsule Take 100 mg by mouth as needed.        . furosemide (LASIX) 40 MG tablet Take 60 mg by mouth daily.       [name] glucosamine-chondroitin 500-400 MG tablet Take 1 tablet by mouth daily.        . magnesium oxide (MAG-OX) 400 MG tablet Take 400 mg by mouth daily.        . metoprolol succinate (TOPROL-XL) 100 MG 24 hr tablet Take 1 tablet (100 mg total) by mouth daily.  30 tablet  0  . omeprazole (PRILOSEC) 40 MG capsule Take 40 mg by mouth daily.        . vitamin C (ASCORBIC ACID) 500 MG tablet Take 500 mg by mouth daily.        [name] warfarin (COUMADIN) 5 MG tablet Take 5 mg by mouth daily.        . Zinc 25 MG TABS 1 tab po qd         Allergies  Allergen Reactions  . Acetaminophen   . Aggrenox (Aspirin-Dipyridamole Er)   . Ginkgo Biloba   . Lisinopril   .  Naproxen     Review of Systems negative except from HPI and PMH  Physical Exam BP 127/55  Pulse 68  Ht 5\' 4"  (1.626 m)  Wt 218 lb (98.884 kg)  BMI 37.42 kg/m2 Well developed and nourished in no acute distress all dressed in blue HENT normal Neck supple   Clear Regular rate and rhythm, 2/6 systolic M Abd-soft with active BS No Clubbing cyanosis trace edema Skin-warm and dry A & Oriented  Grossly normal sensory and motor function     Assessment and  Plan

## 2012-08-12 ENCOUNTER — Other Ambulatory Visit: Payer: Self-pay | Admitting: *Deleted

## 2012-08-12 DIAGNOSIS — I4891 Unspecified atrial fibrillation: Secondary | ICD-10-CM

## 2012-08-12 MED ORDER — METOPROLOL SUCCINATE ER 100 MG PO TB24: 100.0000 mg | ORAL_TABLET | Freq: Every day | ORAL | Status: DC

## 2012-10-03 ENCOUNTER — Encounter: Payer: Medicare Other | Admitting: *Deleted

## 2012-10-05 ENCOUNTER — Encounter: Payer: Self-pay | Admitting: *Deleted

## 2012-11-10 ENCOUNTER — Encounter: Payer: Self-pay | Admitting: *Deleted

## 2012-11-17 ENCOUNTER — Encounter: Payer: Self-pay | Admitting: Internal Medicine

## 2012-11-18 ENCOUNTER — Encounter: Payer: Self-pay | Admitting: *Deleted

## 2012-11-23 ENCOUNTER — Ambulatory Visit (INDEPENDENT_AMBULATORY_CARE_PROVIDER_SITE_OTHER): Payer: Medicare Other | Admitting: *Deleted

## 2012-11-23 ENCOUNTER — Encounter: Payer: Self-pay | Admitting: Internal Medicine

## 2012-11-23 DIAGNOSIS — I495 Sick sinus syndrome: Secondary | ICD-10-CM

## 2012-11-23 DIAGNOSIS — Z950 Presence of cardiac pacemaker: Secondary | ICD-10-CM

## 2012-11-23 LAB — REMOTE PACEMAKER DEVICE
AL AMPLITUDE: 1.9 mv
AL IMPEDENCE PM: 550 Ohm
BATTERY VOLTAGE: 2.93 V
RV LEAD AMPLITUDE: 8.6 mv

## 2012-12-13 ENCOUNTER — Encounter: Payer: Self-pay | Admitting: *Deleted

## 2012-12-20 ENCOUNTER — Telehealth: Payer: Self-pay | Admitting: Internal Medicine

## 2012-12-20 NOTE — Telephone Encounter (Signed)
New Prob    Daughter would like to know if mothers (pt) transmission was received.

## 2012-12-20 NOTE — Telephone Encounter (Signed)
Transmission received, daughter aware.

## 2013-02-20 ENCOUNTER — Encounter: Payer: Medicare Other | Admitting: *Deleted

## 2013-02-21 ENCOUNTER — Encounter: Payer: Self-pay | Admitting: *Deleted

## 2013-04-06 ENCOUNTER — Ambulatory Visit (INDEPENDENT_AMBULATORY_CARE_PROVIDER_SITE_OTHER): Payer: Medicare Other | Admitting: *Deleted

## 2013-04-06 DIAGNOSIS — I4891 Unspecified atrial fibrillation: Secondary | ICD-10-CM

## 2013-04-06 LAB — PACEMAKER DEVICE OBSERVATION
BAMS-0001: 150 {beats}/min
BAMS-0003: 60 {beats}/min
BATTERY VOLTAGE: 2.9478 V
RV LEAD AMPLITUDE: 8.1 mv
VENTRICULAR PACING PM: 23

## 2013-04-06 NOTE — Progress Notes (Signed)
PPM check in office. 

## 2013-04-20 ENCOUNTER — Encounter: Payer: Self-pay | Admitting: Internal Medicine

## 2013-06-29 ENCOUNTER — Encounter: Payer: Self-pay | Admitting: Internal Medicine

## 2013-06-29 ENCOUNTER — Ambulatory Visit (INDEPENDENT_AMBULATORY_CARE_PROVIDER_SITE_OTHER): Payer: Medicare Other | Admitting: Internal Medicine

## 2013-06-29 VITALS — BP 127/60 | HR 60 | Ht 60.0 in | Wt 221.0 lb

## 2013-06-29 DIAGNOSIS — I495 Sick sinus syndrome: Secondary | ICD-10-CM

## 2013-06-29 DIAGNOSIS — Z950 Presence of cardiac pacemaker: Secondary | ICD-10-CM

## 2013-06-29 DIAGNOSIS — I4891 Unspecified atrial fibrillation: Secondary | ICD-10-CM

## 2013-06-29 NOTE — Progress Notes (Signed)
  [facility] Patient Care Team: [name], MD as PCP - General (Family Medicine)   HPI  Connie Weaver is a 79 y.o. female Seen in followup for paroxysmal atrial fibrillation and diastolic heart failure and bradycardia for which he underwent pacemaker implantation September 2012  She continues to have some shortness of breath. She had hoped that this would go away with her pacemaker.   It is her impression at her daughter's impression that she is better post pacing  Past Medical History  Diagnosis Date  . HTN (hypertension)   . Chronic diastolic heart failure     echo 05/2010: EF 60-65%; mild AI, mild MR; mod to severe TR; PASP 42     . Arthritis   . Atrial fibrillation   . Pacemaker -St. Jude   . Sinus bradycardia   . Syncope     Past Surgical History  Procedure Laterality Date  . Hernia repair    . Cholecystectomy    . Status post carpal tunnel repair.      Current Outpatient Prescriptions  Medication Sig Dispense Refill  . acetaminophen (TYLENOL) 650 MG CR tablet Take 650 mg by mouth every 8 (eight) hours as needed.       [name] atorvastatin (LIPITOR) 80 MG tablet Take 80 mg by mouth daily.      [name] docusate sodium (COLACE) 100 MG capsule Take 100 mg by mouth as needed.        . furosemide (LASIX) 40 MG tablet Take 60 mg by mouth daily.       [name] glucosamine-chondroitin 500-400 MG tablet Take 1 tablet by mouth daily.        . magnesium oxide (MAG-OX) 400 MG tablet Take 400 mg by mouth daily.        . metoprolol succinate (TOPROL-XL) 100 MG 24 hr tablet Take 1 tablet (100 mg total) by mouth daily.  30 tablet  6  . omeprazole (PRILOSEC) 40 MG capsule Take 40 mg by mouth daily.        . vitamin C (ASCORBIC ACID) 500 MG tablet Take 500 mg by mouth daily.        [name] warfarin (COUMADIN) 5 MG tablet Take 5 mg by mouth daily.        . Zinc 25 MG TABS 1 tab po qd        No current facility-administered medications for this visit.    Allergies  Allergen Reactions  . Aggrenox  [Aspirin-Dipyridamole Er]   . Ginkgo Biloba   . Lisinopril   . Naproxen     Review of Systems negative except from HPI and PMH  Physical Exam BP 127/60  Pulse 60  Ht 5' (1.524 m)  Wt 221 lb (100.245 kg)  BMI 43.16 kg/m2 Well developed and nourished in no acute distress all dressed in blue HENT normal Neck supple   Clear Regular rate and rhythm, 2/6 systolic M Abd-soft with active BS No Clubbing cyanosis trace edema Skin-warm and dry A & Oriented  Grossly normal sensory and motor function     Assessment and  Plan

## 2013-06-29 NOTE — Patient Instructions (Addendum)
Remote monitoring is used to monitor your Pacemaker of ICD from home. This monitoring reduces the number of office visits required to check your device to one time per year. It allows [place] to keep an eye on the functioning of your device to ensure it is working properly. You are scheduled for a device check from home on 10/02/13. You may send your transmission at any time that day. If you have a wireless device, the transmission will be sent automatically. After your physician reviews your transmission, you will receive a postcard with your next transmission date.  Your physician wants you to follow-up in: one year with Dr. Graciela Husbands.  You will receive a reminder letter in the mail two months in advance. If you don't receive a letter, please call our office to schedule the follow-up appointment.

## 2013-06-30 LAB — PACEMAKER DEVICE OBSERVATION
AL AMPLITUDE: 1.2 mv
BAMS-0001: 150 {beats}/min
BATTERY VOLTAGE: 2.9478 V
DEVICE MODEL PM: 7281691
RV LEAD AMPLITUDE: 10.2 mv
RV LEAD IMPEDENCE PM: 675 Ohm

## 2013-07-24 ENCOUNTER — Encounter: Payer: Self-pay | Admitting: Internal Medicine

## 2013-10-02 ENCOUNTER — Other Ambulatory Visit: Payer: Self-pay | Admitting: Internal Medicine

## 2013-10-02 ENCOUNTER — Encounter: Payer: Medicare Other | Admitting: *Deleted

## 2013-10-11 ENCOUNTER — Encounter: Payer: Self-pay | Admitting: *Deleted

## 2013-10-27 ENCOUNTER — Encounter: Payer: Medicare Other | Admitting: *Deleted

## 2013-10-27 DIAGNOSIS — I495 Sick sinus syndrome: Secondary | ICD-10-CM

## 2013-10-27 DIAGNOSIS — Z950 Presence of cardiac pacemaker: Secondary | ICD-10-CM

## 2013-10-27 LAB — MDC_IDC_ENUM_SESS_TYPE_REMOTE
Battery Voltage: 2.93 V
Brady Statistic AP VP Percent: 3.8 %
Brady Statistic AP VS Percent: 66 %
Brady Statistic AS VP Percent: 1.6 %
Implantable Pulse Generator Model: 2110
Implantable Pulse Generator Serial Number: 7281691
Lead Channel Impedance Value: 530 Ohm
Lead Channel Pacing Threshold Amplitude: 0.625 V
Lead Channel Pacing Threshold Amplitude: 0.625 V
Lead Channel Pacing Threshold Pulse Width: 0.5 ms
Lead Channel Setting Pacing Amplitude: 1.625
MDC IDC MSMT BATTERY REMAINING LONGEVITY: 94 mo
MDC IDC MSMT LEADCHNL RA SENSING INTR AMPL: 1.1 mV
MDC IDC MSMT LEADCHNL RV IMPEDANCE VALUE: 660 Ohm
MDC IDC MSMT LEADCHNL RV PACING THRESHOLD PULSEWIDTH: 0.5 ms
MDC IDC MSMT LEADCHNL RV SENSING INTR AMPL: 9.2 mV
MDC IDC SESS DTM: 20150109150855
MDC IDC SET LEADCHNL RV PACING AMPLITUDE: 0.875
MDC IDC SET LEADCHNL RV PACING PULSEWIDTH: 0.5 ms
MDC IDC SET LEADCHNL RV SENSING SENSITIVITY: 2 mV
MDC IDC STAT BRADY AS VS PERCENT: 29 %
MDC IDC STAT BRADY RA PERCENT PACED: 69 %
MDC IDC STAT BRADY RV PERCENT PACED: 5.5 %

## 2013-11-02 ENCOUNTER — Encounter: Payer: Self-pay | Admitting: *Deleted

## 2013-11-10 ENCOUNTER — Encounter: Payer: Self-pay | Admitting: Internal Medicine

## 2014-01-29 ENCOUNTER — Encounter: Payer: Self-pay | Admitting: Internal Medicine

## 2014-01-29 ENCOUNTER — Ambulatory Visit (INDEPENDENT_AMBULATORY_CARE_PROVIDER_SITE_OTHER): Payer: Medicare Other | Admitting: *Deleted

## 2014-01-29 DIAGNOSIS — I4891 Unspecified atrial fibrillation: Secondary | ICD-10-CM

## 2014-02-02 LAB — MDC_IDC_ENUM_SESS_TYPE_REMOTE
Brady Statistic AP VP Percent: 3.4 %
Brady Statistic AS VP Percent: 3.8 %
Brady Statistic AS VS Percent: 30 %
Implantable Pulse Generator Model: 2110
Implantable Pulse Generator Serial Number: 7281691
Lead Channel Impedance Value: 480 Ohm
Lead Channel Impedance Value: 630 Ohm
Lead Channel Pacing Threshold Amplitude: 0.625 V
Lead Channel Pacing Threshold Pulse Width: 0.5 ms
Lead Channel Pacing Threshold Pulse Width: 0.5 ms
Lead Channel Sensing Intrinsic Amplitude: 8.7 mV
Lead Channel Setting Pacing Amplitude: 0.875
Lead Channel Setting Pacing Amplitude: 1.75 V
Lead Channel Setting Pacing Pulse Width: 0.5 ms
MDC IDC MSMT BATTERY REMAINING LONGEVITY: 92 mo
MDC IDC MSMT BATTERY VOLTAGE: 2.93 V
MDC IDC MSMT LEADCHNL RA PACING THRESHOLD AMPLITUDE: 0.75 V
MDC IDC MSMT LEADCHNL RA SENSING INTR AMPL: 1 mV
MDC IDC SESS DTM: 20150415143536
MDC IDC SET LEADCHNL RV SENSING SENSITIVITY: 2 mV
MDC IDC STAT BRADY AP VS PERCENT: 62 %
MDC IDC STAT BRADY RA PERCENT PACED: 65 %
MDC IDC STAT BRADY RV PERCENT PACED: 7.2 %

## 2014-02-21 ENCOUNTER — Encounter: Payer: Self-pay | Admitting: Cardiology

## 2014-05-02 ENCOUNTER — Ambulatory Visit: Payer: Medicare Other | Admitting: *Deleted

## 2014-05-02 ENCOUNTER — Telehealth: Payer: Self-pay | Admitting: Cardiology

## 2014-05-02 DIAGNOSIS — I495 Sick sinus syndrome: Secondary | ICD-10-CM | POA: Diagnosis not present

## 2014-05-02 NOTE — Telephone Encounter (Signed)
LMOVM reminding pt to send remote transmission.   

## 2014-06-06 LAB — MDC_IDC_ENUM_SESS_TYPE_REMOTE
Battery Remaining Longevity: 8.7
Battery Remaining Percentage: 74 %
Brady Statistic RA Percent Paced: 63 %
Brady Statistic RV Percent Paced: 6.7 %
Lead Channel Impedance Value: 530 Ohm
Lead Channel Pacing Threshold Pulse Width: 0.4 ms
Lead Channel Sensing Intrinsic Amplitude: 2 mV
Lead Channel Sensing Intrinsic Amplitude: 9.3 mV
Lead Channel Setting Pacing Amplitude: 1.75 V
Lead Channel Setting Pacing Pulse Width: 0.5 ms
Lead Channel Setting Sensing Sensitivity: 2 mV
MDC IDC MSMT BATTERY VOLTAGE: 2.95 V
MDC IDC MSMT LEADCHNL RA PACING THRESHOLD AMPLITUDE: 0.625 V
MDC IDC MSMT LEADCHNL RA PACING THRESHOLD PULSEWIDTH: 0.4 ms
MDC IDC MSMT LEADCHNL RV IMPEDANCE VALUE: 640 Ohm
MDC IDC MSMT LEADCHNL RV PACING THRESHOLD AMPLITUDE: 0.625 V
MDC IDC PG SERIAL: 7281691
MDC IDC SET LEADCHNL RV PACING AMPLITUDE: 0.875

## 2014-06-12 ENCOUNTER — Encounter: Payer: Self-pay | Admitting: Cardiology

## 2014-06-18 ENCOUNTER — Encounter: Payer: Self-pay | Admitting: Internal Medicine

## 2014-07-11 ENCOUNTER — Ambulatory Visit (INDEPENDENT_AMBULATORY_CARE_PROVIDER_SITE_OTHER): Payer: Medicare Other | Admitting: Internal Medicine

## 2014-07-11 ENCOUNTER — Encounter: Payer: Self-pay | Admitting: Internal Medicine

## 2014-07-11 ENCOUNTER — Telehealth: Payer: Self-pay | Admitting: Internal Medicine

## 2014-07-11 VITALS — BP 114/60 | HR 67 | Ht 64.0 in

## 2014-07-11 DIAGNOSIS — I495 Sick sinus syndrome: Secondary | ICD-10-CM

## 2014-07-11 DIAGNOSIS — I480 Paroxysmal atrial fibrillation: Secondary | ICD-10-CM

## 2014-07-11 DIAGNOSIS — Z950 Presence of cardiac pacemaker: Secondary | ICD-10-CM

## 2014-07-11 DIAGNOSIS — I4891 Unspecified atrial fibrillation: Secondary | ICD-10-CM

## 2014-07-11 NOTE — Patient Instructions (Addendum)
Remote monitoring is used to monitor your pacemaker from home. This monitoring reduces the number of office visits required to check your device to one time per year. It allows [place] to keep an eye on the functioning of your device to ensure it is working properly. You are scheduled for a device check from home on 10-15-2014. You may send your transmission at any time that day. If you have a wireless device, the transmission will be sent automatically. After your physician reviews your transmission, you will receive a postcard with your next transmission date.  Your physician recommends that you schedule a follow-up appointment in: 12 months with Dr.Klein  Your physician recommends that you continue on your current medications as directed. Please refer to the Current Medication list given to you today.

## 2014-07-11 NOTE — Telephone Encounter (Signed)
New Message  Pt daughter called requets a script for a forearm support walking attachment. Please call

## 2014-07-11 NOTE — Telephone Encounter (Signed)
They are also asking for rx for new walker (forearm attachment will not fit her walker secondary to it being older)  Rx written for rolling walker and forearm support attachment (left rx at front desk_ She will come by to pick up and thankful for help

## 2014-07-11 NOTE — Progress Notes (Signed)
[facility] Patient Care Team: [name], MD as PCP - General (Family Medicine)   HPI  Connie Weaver is a 80 y.o. female Seen in followup for paroxysmal atrial fibrillation and diastolic heart failure and bradycardia for which he underwent pacemaker implantation September 2012  She continues to have some shortness of breath. She had hoped that this would go away with her pacemaker.   It is her impression at her daughter's impression that she is better post pacing  Unfortunately, this year  she still and broke her wrist and she still trying to regain her function using a walker.  Past Medical History  Diagnosis Date  . HTN (hypertension)   . Chronic diastolic heart failure     echo 05/2010: EF 60-65%; mild AI, mild MR; mod to severe TR; PASP 42     . Arthritis   . Atrial fibrillation   . Pacemaker -St. Jude   . Sinus bradycardia   . Syncope     Past Surgical History  Procedure Laterality Date  . Hernia repair    . Cholecystectomy    . Status post carpal tunnel repair.      Current Outpatient Prescriptions  Medication Sig Dispense Refill  . acetaminophen (TYLENOL) 650 MG CR tablet Take 650 mg by mouth every 8 (eight) hours as needed.       [name] atorvastatin (LIPITOR) 80 MG tablet Take 80 mg by mouth daily.      [name] docusate sodium (COLACE) 100 MG capsule Take 100 mg by mouth as needed.        . furosemide (LASIX) 40 MG tablet Take 60 mg by mouth daily.       [name] glucosamine-chondroitin 500-400 MG tablet Take 1 tablet by mouth daily.        . magnesium oxide (MAG-OX) 400 MG tablet Take 400 mg by mouth daily.        . metoprolol succinate (TOPROL-XL) 100 MG 24 hr tablet TAKE 1 TABLET ONCE DAILY.  30 tablet  3  . omeprazole (PRILOSEC) 40 MG capsule Take 40 mg by mouth daily.        . vitamin C (ASCORBIC ACID) 500 MG tablet Take 500 mg by mouth daily.        [name] warfarin (COUMADIN) 5 MG tablet Take 5 mg by mouth daily.        . Zinc 25 MG TABS 1 tab po qd        No current  facility-administered medications for this visit.    Allergies  Allergen Reactions  . Aggrenox [Aspirin-Dipyridamole Er]   . Ginkgo Biloba   . Lisinopril   . Naproxen     Review of Systems negative except from HPI and PMH  Physical Exam BP 114/60  Pulse 67  Ht  (1.626 m) Well developed and nourished in no acute distress all dressed in green HENT normal  Device pocket well healed; without hematoma or erythema.    Neck supple   Clear Regular rate and rhythm, 2/6 systolic M Abd-soft with active BS No Clubbing cyanosis trace edema Skin-warm and dry A & Oriented  Grossly normal sensory and motor function     Assessment and  Plan  Paroxysmal atrial fibrillation  Pacemaker St Jude The patient's device was interrogated.  The information was reviewed. No changes were made in the programming.     Syncope  Bradycardia  She is doing relatively well from a cardiac point of view. I'm concerned about her ambulation with  a walker and only to control one side and that has described for them the forearm support walking attachment  She continues on warfarin

## 2014-07-12 LAB — MDC_IDC_ENUM_SESS_TYPE_INCLINIC
Brady Statistic RA Percent Paced: 61 %
Brady Statistic RV Percent Paced: 5.7 %
Implantable Pulse Generator Model: 2110
Implantable Pulse Generator Serial Number: 7281691
Lead Channel Impedance Value: 475 Ohm
Lead Channel Impedance Value: 650 Ohm
Lead Channel Pacing Threshold Amplitude: 0.5 V
Lead Channel Pacing Threshold Pulse Width: 0.5 ms
Lead Channel Sensing Intrinsic Amplitude: 1 mV
Lead Channel Sensing Intrinsic Amplitude: 11.1 mV
Lead Channel Setting Pacing Amplitude: 1.625
Lead Channel Setting Sensing Sensitivity: 2 mV
MDC IDC MSMT BATTERY REMAINING LONGEVITY: 111.6 mo
MDC IDC MSMT BATTERY VOLTAGE: 2.93 V
MDC IDC MSMT LEADCHNL RA PACING THRESHOLD AMPLITUDE: 0.625 V
MDC IDC MSMT LEADCHNL RA PACING THRESHOLD PULSEWIDTH: 0.5 ms
MDC IDC SESS DTM: 20150923153641
MDC IDC SET LEADCHNL RV PACING AMPLITUDE: 0.75 V
MDC IDC SET LEADCHNL RV PACING PULSEWIDTH: 0.5 ms

## 2014-08-22 ENCOUNTER — Other Ambulatory Visit: Payer: Self-pay | Admitting: Internal Medicine

## 2014-10-15 ENCOUNTER — Ambulatory Visit (INDEPENDENT_AMBULATORY_CARE_PROVIDER_SITE_OTHER): Payer: Medicare Other | Admitting: *Deleted

## 2014-10-15 ENCOUNTER — Telehealth: Payer: Self-pay | Admitting: Cardiology

## 2014-10-15 DIAGNOSIS — I495 Sick sinus syndrome: Secondary | ICD-10-CM

## 2014-10-15 NOTE — Telephone Encounter (Signed)
Spoke with pt and reminded pt of remote transmission that is due today. Pt verbalized understanding.   

## 2014-10-17 ENCOUNTER — Encounter: Payer: Self-pay | Admitting: Cardiology

## 2014-10-17 DIAGNOSIS — I495 Sick sinus syndrome: Secondary | ICD-10-CM

## 2014-10-17 LAB — MDC_IDC_ENUM_SESS_TYPE_REMOTE
Battery Voltage: 2.93 V
Brady Statistic AP VP Percent: 2.4 %
Brady Statistic AP VS Percent: 67 %
Brady Statistic AS VP Percent: 1 %
Brady Statistic RA Percent Paced: 68 %
Brady Statistic RV Percent Paced: 2.7 %
Implantable Pulse Generator Serial Number: 7281691
Lead Channel Impedance Value: 640 Ohm
Lead Channel Pacing Threshold Amplitude: 0.625 V
Lead Channel Pacing Threshold Pulse Width: 0.5 ms
Lead Channel Sensing Intrinsic Amplitude: 8.1 mV
Lead Channel Setting Pacing Amplitude: 1 V
Lead Channel Setting Pacing Amplitude: 1.625
Lead Channel Setting Sensing Sensitivity: 2 mV
MDC IDC MSMT BATTERY REMAINING LONGEVITY: 94 mo
MDC IDC MSMT BATTERY REMAINING PERCENTAGE: 68 %
MDC IDC MSMT LEADCHNL RA IMPEDANCE VALUE: 440 Ohm
MDC IDC MSMT LEADCHNL RA SENSING INTR AMPL: 0.8 mV
MDC IDC MSMT LEADCHNL RV PACING THRESHOLD AMPLITUDE: 0.75 V
MDC IDC MSMT LEADCHNL RV PACING THRESHOLD PULSEWIDTH: 0.5 ms
MDC IDC SESS DTM: 20151230144719
MDC IDC SET LEADCHNL RV PACING PULSEWIDTH: 0.5 ms
MDC IDC STAT BRADY AS VS PERCENT: 30 %

## 2014-10-17 NOTE — Progress Notes (Signed)
Remote pacemaker transmission.   

## 2014-11-02 ENCOUNTER — Encounter: Payer: Self-pay | Admitting: Cardiology

## 2014-11-07 ENCOUNTER — Encounter: Payer: Self-pay | Admitting: Internal Medicine

## 2014-11-14 ENCOUNTER — Telehealth: Payer: Self-pay | Admitting: Internal Medicine

## 2014-11-14 NOTE — Telephone Encounter (Signed)
New Message  Pt daughter called to see if forms were received to obtain a lift chair from [employer]. This was supposed to be faxed back in December and she is requesting a call back to follow up.   [employer] Contact number: Jairo BenSharron Robinson [phone] Ext [phone] Fax # [phone]

## 2015-01-16 ENCOUNTER — Ambulatory Visit (INDEPENDENT_AMBULATORY_CARE_PROVIDER_SITE_OTHER): Payer: Medicare Other | Admitting: *Deleted

## 2015-01-16 ENCOUNTER — Telehealth: Payer: Self-pay | Admitting: Cardiology

## 2015-01-16 DIAGNOSIS — I495 Sick sinus syndrome: Secondary | ICD-10-CM

## 2015-01-16 NOTE — Telephone Encounter (Signed)
Spoke with pt and reminded pt of remote transmission that is due today. Pt verbalized understanding.   

## 2015-01-17 ENCOUNTER — Encounter: Payer: Self-pay | Admitting: Cardiology

## 2015-01-17 ENCOUNTER — Encounter: Payer: Self-pay | Admitting: Internal Medicine

## 2015-01-17 DIAGNOSIS — I495 Sick sinus syndrome: Secondary | ICD-10-CM

## 2015-01-17 LAB — MDC_IDC_ENUM_SESS_TYPE_REMOTE
Brady Statistic RV Percent Paced: 3.3 %
Lead Channel Pacing Threshold Amplitude: 0.75 V
Lead Channel Pacing Threshold Pulse Width: 0.5 ms
Lead Channel Pacing Threshold Pulse Width: 0.5 ms
Lead Channel Setting Pacing Amplitude: 1 V
Lead Channel Setting Pacing Amplitude: 1.625
Lead Channel Setting Pacing Pulse Width: 0.5 ms
Lead Channel Setting Sensing Sensitivity: 2 mV
MDC IDC MSMT LEADCHNL RA IMPEDANCE VALUE: 450 Ohm
MDC IDC MSMT LEADCHNL RA PACING THRESHOLD AMPLITUDE: 0.5 V
MDC IDC MSMT LEADCHNL RA SENSING INTR AMPL: 1 mV
MDC IDC MSMT LEADCHNL RV IMPEDANCE VALUE: 640 Ohm
MDC IDC MSMT LEADCHNL RV SENSING INTR AMPL: 8.2 mV
MDC IDC PG SERIAL: 7281691
MDC IDC STAT BRADY RA PERCENT PACED: 78 %

## 2015-01-17 NOTE — Progress Notes (Signed)
Remote pacemaker transmission.   

## 2015-01-23 ENCOUNTER — Encounter: Payer: Self-pay | Admitting: Cardiology

## 2015-04-18 ENCOUNTER — Encounter: Payer: Medicare Other | Admitting: *Deleted

## 2015-04-18 ENCOUNTER — Telehealth: Payer: Self-pay | Admitting: Cardiology

## 2015-04-18 NOTE — Telephone Encounter (Signed)
LMOVM reminding pt to send remote transmission.   

## 2015-04-23 ENCOUNTER — Encounter: Payer: Self-pay | Admitting: Cardiology

## 2015-04-24 ENCOUNTER — Ambulatory Visit (INDEPENDENT_AMBULATORY_CARE_PROVIDER_SITE_OTHER): Payer: Medicare Other | Admitting: *Deleted

## 2015-04-24 ENCOUNTER — Encounter: Payer: Self-pay | Admitting: Internal Medicine

## 2015-04-24 DIAGNOSIS — I495 Sick sinus syndrome: Secondary | ICD-10-CM

## 2015-04-24 NOTE — Progress Notes (Signed)
Remote pacemaker transmission.   

## 2015-05-03 LAB — CUP PACEART REMOTE DEVICE CHECK
Battery Remaining Percentage: 81 %
Battery Voltage: 2.93 V
Brady Statistic AP VP Percent: 3 %
Brady Statistic AP VS Percent: 80 %
Brady Statistic AS VP Percent: 1 %
Brady Statistic AS VS Percent: 17 %
Brady Statistic RA Percent Paced: 80 %
Date Time Interrogation Session: 20160706145030
Lead Channel Impedance Value: 460 Ohm
Lead Channel Pacing Threshold Amplitude: 0.625 V
Lead Channel Pacing Threshold Pulse Width: 0.5 ms
Lead Channel Sensing Intrinsic Amplitude: 10 mV
Lead Channel Setting Pacing Amplitude: 0.875
Lead Channel Setting Pacing Amplitude: 1.5 V
Lead Channel Setting Sensing Sensitivity: 2 mV
MDC IDC MSMT BATTERY REMAINING LONGEVITY: 109 mo
MDC IDC MSMT LEADCHNL RA PACING THRESHOLD AMPLITUDE: 0.5 V
MDC IDC MSMT LEADCHNL RA SENSING INTR AMPL: 1.2 mV
MDC IDC MSMT LEADCHNL RV IMPEDANCE VALUE: 690 Ohm
MDC IDC MSMT LEADCHNL RV PACING THRESHOLD PULSEWIDTH: 0.5 ms
MDC IDC PG SERIAL: 7281691
MDC IDC SET LEADCHNL RV PACING PULSEWIDTH: 0.5 ms
MDC IDC STAT BRADY RV PERCENT PACED: 3.4 %
Pulse Gen Model: 2110

## 2015-05-29 ENCOUNTER — Encounter: Payer: Self-pay | Admitting: Cardiology

## 2015-07-30 ENCOUNTER — Encounter: Payer: Self-pay | Admitting: Internal Medicine

## 2015-07-30 ENCOUNTER — Ambulatory Visit (INDEPENDENT_AMBULATORY_CARE_PROVIDER_SITE_OTHER): Payer: Medicare Other | Admitting: Internal Medicine

## 2015-07-30 VITALS — BP 122/64 | HR 60 | Ht 64.0 in | Wt 226.0 lb

## 2015-07-30 DIAGNOSIS — I5032 Chronic diastolic (congestive) heart failure: Secondary | ICD-10-CM

## 2015-07-30 DIAGNOSIS — I480 Paroxysmal atrial fibrillation: Secondary | ICD-10-CM | POA: Diagnosis not present

## 2015-07-30 DIAGNOSIS — Z950 Presence of cardiac pacemaker: Secondary | ICD-10-CM | POA: Diagnosis not present

## 2015-07-30 NOTE — Patient Instructions (Signed)
Medication Instructions: 1) Increase lasix (furosemide) to 80 mg daily x 3 days then resume your normal dosing  Labwork: - none  Procedures/Testing: - none  Follow-Up: - Remote monitoring is used to monitor your Pacemaker of ICD from home. This monitoring reduces the number of office visits required to check your device to one time per year. It allows [place] to keep an eye on the functioning of your device to ensure it is working properly. You are scheduled for a device check from home on 10/29/15. You may send your transmission at any time that day. If you have a wireless device, the transmission will be sent automatically. After your physician reviews your transmission, you will receive a postcard with your next transmission date.  - Your physician wants you to follow-up in: 1 year with Dr. Graciela Husbands. You will receive a reminder letter in the mail two months in advance. If you don't receive a letter, please call our office to schedule the follow-up appointment.  Any Additional Special Instructions Will Be Listed Below (If Applicable).

## 2015-07-30 NOTE — Progress Notes (Signed)
[facility] Patient Care Team: [name], MD as PCP - General (Family Medicine)   HPI  Connie Weaver is a 81 y.o. female Seen in followup for paroxysmal atrial fibrillation and diastolic heart failure and bradycardia for which she underwent pacemaker implantation September 2012   It is  her daughter's impression that she is better post pacing  She's had no recurrent syncope  She has some problems with shortness of breath with exertion. She has not had much peripheral edema.  Past Medical History  Diagnosis Date  . HTN (hypertension)   . Chronic diastolic heart failure (HCC)     echo 05/2010: EF 60-65%; mild AI, mild MR; mod to severe TR; PASP 42     . Arthritis   . Atrial fibrillation (HCC)   . Pacemaker -St. Jude   . Sinus bradycardia   . Syncope     Past Surgical History  Procedure Laterality Date  . Hernia repair    . Cholecystectomy    . Status post carpal tunnel repair.      Current Outpatient Prescriptions  Medication Sig Dispense Refill  . acetaminophen (TYLENOL) 650 MG CR tablet Take 650 mg by mouth every 8 (eight) hours as needed.     [name] atorvastatin (LIPITOR) 80 MG tablet Take 80 mg by mouth daily.    [name] docusate sodium (COLACE) 100 MG capsule Take 100 mg by mouth daily as needed (CONSTIPATION).     . furosemide (LASIX) 40 MG tablet Take 60 mg by mouth daily.     [name] glucosamine-chondroitin 500-400 MG tablet Take 1 tablet by mouth daily.      . magnesium oxide (MAG-OX) 400 MG tablet Take 400 mg by mouth daily.      . metoprolol succinate (TOPROL-XL) 100 MG 24 hr tablet Take 100 mg by mouth daily. Take with or immediately following a meal.    . omeprazole (PRILOSEC) 40 MG capsule Take 40 mg by mouth daily.      . vitamin C (ASCORBIC ACID) 500 MG tablet Take 500 mg by mouth daily.      [name] warfarin (COUMADIN) 5 MG tablet Take 5 mg by mouth daily.      . Zinc 25 MG TABS 1 tab po qd      No current facility-administered medications for this visit.    Allergies   Allergen Reactions  . Aggrenox [Aspirin-Dipyridamole Er]   . Ginkgo Biloba   . Lisinopril   . Naproxen     Review of Systems negative except from HPI and PMH  Physical Exam BP 122/64 mmHg  Pulse 60  Ht  (1.626 m)  Wt 226 lb (102.513 kg)  BMI 38.77 kg/m2 Well developed and nourished in no acute distress all dressed in green HENT normal  Device pocket well healed; without hematoma or erythema.    Neck supple   Clear Regular rate and rhythm, 2/6 systolic M Abd-soft with active BS No Clubbing cyanosis trace edema Skin-warm and dry A & Oriented  Grossly normal sensory and motor function   ECG demonstrates   atrial pacing at 60 Intervals 14/08/41 Axis left -23 Assessment and  Plan  Paroxysmal atrial fibrillation  Pacemaker St Jude The patient's device was interrogated.  The information was reviewed. No changes were made in the programming.     Syncope  Bradycardia  She is doing relatively well from a cardiac point of view. I'm concerned about her ambulation with a walker and only to control one side and that  has described for them the forearm support walking attachment  She continues on warfarin  She has some degree of fluid overload. We'll increase her Lasix from 40--80 per 3 days.

## 2015-08-01 LAB — CUP PACEART INCLINIC DEVICE CHECK
Battery Remaining Longevity: 108 mo
Battery Voltage: 2.93 V
Brady Statistic RA Percent Paced: 81 %
Brady Statistic RV Percent Paced: 3.5 %
Date Time Interrogation Session: 20161011195512
Implantable Lead Implant Date: 20120921
Implantable Lead Location: 753859
Implantable Lead Model: 1948
Lead Channel Impedance Value: 475 Ohm
Lead Channel Impedance Value: 750 Ohm
Lead Channel Pacing Threshold Amplitude: 0.5 V
Lead Channel Pacing Threshold Amplitude: 0.5 V
Lead Channel Pacing Threshold Amplitude: 0.75 V
Lead Channel Pacing Threshold Pulse Width: 0.5 ms
Lead Channel Pacing Threshold Pulse Width: 0.5 ms
Lead Channel Pacing Threshold Pulse Width: 0.5 ms
Lead Channel Sensing Intrinsic Amplitude: 11.2 mV
Lead Channel Setting Pacing Amplitude: 1.5 V
Lead Channel Setting Pacing Pulse Width: 0.5 ms
Lead Channel Setting Sensing Sensitivity: 2 mV
MDC IDC LEAD IMPLANT DT: 20120921
MDC IDC LEAD LOCATION: 753860
MDC IDC MSMT LEADCHNL RA SENSING INTR AMPL: 1 mV
MDC IDC MSMT LEADCHNL RV PACING THRESHOLD AMPLITUDE: 0.75 V
MDC IDC MSMT LEADCHNL RV PACING THRESHOLD PULSEWIDTH: 0.5 ms
MDC IDC SET LEADCHNL RV PACING AMPLITUDE: 0.875
Pulse Gen Serial Number: 7281691

## 2015-10-29 ENCOUNTER — Encounter: Payer: Medicare Other | Admitting: *Deleted

## 2015-10-29 ENCOUNTER — Telehealth: Payer: Self-pay | Admitting: Cardiology

## 2015-10-29 NOTE — Telephone Encounter (Signed)
Spoke with pt and reminded pt of remote transmission that is due today. Pt verbalized understanding.   

## 2015-11-01 ENCOUNTER — Ambulatory Visit (INDEPENDENT_AMBULATORY_CARE_PROVIDER_SITE_OTHER): Payer: Medicare Other | Admitting: *Deleted

## 2015-11-01 DIAGNOSIS — I495 Sick sinus syndrome: Secondary | ICD-10-CM

## 2015-11-04 NOTE — Progress Notes (Signed)
Remote pacemaker transmission.   

## 2015-11-12 LAB — CUP PACEART REMOTE DEVICE CHECK
Battery Remaining Longevity: 101 mo
Battery Voltage: 2.92 V
Brady Statistic AP VP Percent: 1.5 %
Brady Statistic AS VP Percent: 1 %
Brady Statistic AS VS Percent: 9.9 %
Brady Statistic RA Percent Paced: 89 %
Date Time Interrogation Session: 20170113130629
Implantable Lead Implant Date: 20120921
Implantable Lead Location: 753860
Implantable Lead Model: 1948
Lead Channel Impedance Value: 710 Ohm
Lead Channel Pacing Threshold Amplitude: 0.5 V
Lead Channel Pacing Threshold Pulse Width: 0.5 ms
Lead Channel Sensing Intrinsic Amplitude: 1.1 mV
Lead Channel Sensing Intrinsic Amplitude: 10.8 mV
Lead Channel Setting Pacing Amplitude: 0.875
Lead Channel Setting Pacing Amplitude: 1.5 V
MDC IDC LEAD IMPLANT DT: 20120921
MDC IDC LEAD LOCATION: 753859
MDC IDC MSMT BATTERY REMAINING PERCENTAGE: 73 %
MDC IDC MSMT LEADCHNL RA IMPEDANCE VALUE: 460 Ohm
MDC IDC MSMT LEADCHNL RA PACING THRESHOLD PULSEWIDTH: 0.5 ms
MDC IDC MSMT LEADCHNL RV PACING THRESHOLD AMPLITUDE: 0.625 V
MDC IDC SET LEADCHNL RV PACING PULSEWIDTH: 0.5 ms
MDC IDC SET LEADCHNL RV SENSING SENSITIVITY: 2 mV
MDC IDC STAT BRADY AP VS PERCENT: 88 %
MDC IDC STAT BRADY RV PERCENT PACED: 1.8 %
Pulse Gen Model: 2110
Pulse Gen Serial Number: 7281691

## 2015-11-15 ENCOUNTER — Encounter: Payer: Self-pay | Admitting: Cardiology

## 2016-02-03 ENCOUNTER — Telehealth: Payer: Self-pay | Admitting: Cardiology

## 2016-02-03 ENCOUNTER — Ambulatory Visit (INDEPENDENT_AMBULATORY_CARE_PROVIDER_SITE_OTHER): Payer: Medicare Other | Admitting: *Deleted

## 2016-02-03 DIAGNOSIS — I495 Sick sinus syndrome: Secondary | ICD-10-CM

## 2016-02-03 NOTE — Telephone Encounter (Signed)
Spoke with pt and reminded pt of remote transmission that is due today. Pt verbalized understanding.   

## 2016-02-04 NOTE — Progress Notes (Signed)
Remote pacemaker transmission.   

## 2016-03-12 ENCOUNTER — Encounter: Payer: Self-pay | Admitting: Cardiology

## 2016-03-12 LAB — CUP PACEART REMOTE DEVICE CHECK
Battery Voltage: 2.92 V
Brady Statistic AP VP Percent: 1.3 %
Brady Statistic AP VS Percent: 88 %
Brady Statistic AS VP Percent: 1 %
Brady Statistic AS VS Percent: 10 %
Date Time Interrogation Session: 20170417224132
Implantable Lead Implant Date: 20120921
Implantable Lead Implant Date: 20120921
Implantable Lead Location: 753860
Lead Channel Pacing Threshold Amplitude: 0.625 V
Lead Channel Pacing Threshold Amplitude: 0.625 V
Lead Channel Pacing Threshold Pulse Width: 0.5 ms
Lead Channel Sensing Intrinsic Amplitude: 10.9 mV
Lead Channel Setting Pacing Amplitude: 0.875
Lead Channel Setting Pacing Amplitude: 1.625
Lead Channel Setting Pacing Pulse Width: 0.5 ms
MDC IDC LEAD LOCATION: 753859
MDC IDC LEAD MODEL: 1948
MDC IDC MSMT BATTERY REMAINING LONGEVITY: 102 mo
MDC IDC MSMT BATTERY REMAINING PERCENTAGE: 73 %
MDC IDC MSMT LEADCHNL RA IMPEDANCE VALUE: 530 Ohm
MDC IDC MSMT LEADCHNL RA PACING THRESHOLD PULSEWIDTH: 0.5 ms
MDC IDC MSMT LEADCHNL RA SENSING INTR AMPL: 1.2 mV
MDC IDC MSMT LEADCHNL RV IMPEDANCE VALUE: 630 Ohm
MDC IDC SET LEADCHNL RV SENSING SENSITIVITY: 2 mV
MDC IDC STAT BRADY RA PERCENT PACED: 88 %
MDC IDC STAT BRADY RV PERCENT PACED: 1.6 %
Pulse Gen Model: 2110
Pulse Gen Serial Number: 7281691

## 2016-05-04 ENCOUNTER — Ambulatory Visit (INDEPENDENT_AMBULATORY_CARE_PROVIDER_SITE_OTHER): Payer: Medicare Other | Admitting: *Deleted

## 2016-05-04 ENCOUNTER — Telehealth: Payer: Self-pay | Admitting: Cardiology

## 2016-05-04 DIAGNOSIS — I495 Sick sinus syndrome: Secondary | ICD-10-CM | POA: Diagnosis not present

## 2016-05-04 NOTE — Telephone Encounter (Signed)
Confirmed remote transmission w/ pt daughter.   

## 2016-05-04 NOTE — Progress Notes (Signed)
Remote pacemaker transmission.   

## 2016-05-06 ENCOUNTER — Encounter: Payer: Self-pay | Admitting: Cardiology

## 2016-05-07 LAB — CUP PACEART REMOTE DEVICE CHECK
Battery Remaining Percentage: 73 %
Brady Statistic AP VP Percent: 1.4 %
Brady Statistic AP VS Percent: 88 %
Brady Statistic AS VS Percent: 10 %
Implantable Lead Implant Date: 20120921
Implantable Lead Implant Date: 20120921
Implantable Lead Location: 753860
Lead Channel Impedance Value: 510 Ohm
Lead Channel Impedance Value: 640 Ohm
Lead Channel Pacing Threshold Amplitude: 0.625 V
Lead Channel Pacing Threshold Pulse Width: 0.5 ms
Lead Channel Sensing Intrinsic Amplitude: 1.3 mV
Lead Channel Setting Pacing Amplitude: 0.875
Lead Channel Setting Pacing Pulse Width: 0.5 ms
MDC IDC LEAD LOCATION: 753859
MDC IDC LEAD MODEL: 1948
MDC IDC MSMT BATTERY REMAINING LONGEVITY: 102 mo
MDC IDC MSMT BATTERY VOLTAGE: 2.92 V
MDC IDC MSMT LEADCHNL RA PACING THRESHOLD AMPLITUDE: 0.625 V
MDC IDC MSMT LEADCHNL RA PACING THRESHOLD PULSEWIDTH: 0.5 ms
MDC IDC MSMT LEADCHNL RV SENSING INTR AMPL: 10.4 mV
MDC IDC PG SERIAL: 7281691
MDC IDC SESS DTM: 20170717150617
MDC IDC SET LEADCHNL RA PACING AMPLITUDE: 1.625
MDC IDC SET LEADCHNL RV SENSING SENSITIVITY: 2 mV
MDC IDC STAT BRADY AS VP PERCENT: 1 %
MDC IDC STAT BRADY RA PERCENT PACED: 88 %
MDC IDC STAT BRADY RV PERCENT PACED: 1.7 %

## 2016-07-10 ENCOUNTER — Encounter: Payer: Self-pay | Admitting: Internal Medicine

## 2016-07-23 ENCOUNTER — Encounter: Payer: Medicare Other | Admitting: Internal Medicine

## 2016-07-28 ENCOUNTER — Ambulatory Visit (INDEPENDENT_AMBULATORY_CARE_PROVIDER_SITE_OTHER): Payer: Medicare Other | Admitting: Internal Medicine

## 2016-07-28 ENCOUNTER — Encounter: Payer: Self-pay | Admitting: Internal Medicine

## 2016-07-28 VITALS — BP 114/70 | HR 63 | Ht 64.0 in | Wt 222.2 lb

## 2016-07-28 DIAGNOSIS — Z950 Presence of cardiac pacemaker: Secondary | ICD-10-CM

## 2016-07-28 DIAGNOSIS — I480 Paroxysmal atrial fibrillation: Secondary | ICD-10-CM | POA: Diagnosis not present

## 2016-07-28 DIAGNOSIS — I495 Sick sinus syndrome: Secondary | ICD-10-CM

## 2016-07-28 DIAGNOSIS — R001 Bradycardia, unspecified: Secondary | ICD-10-CM

## 2016-07-28 NOTE — Progress Notes (Signed)
[facility] No care team member to display   HPI  Connie Weaver is a 82 y.o. female Seen in followup for paroxysmal atrial fibrillation and diastolic heart failure and bradycardia for which she underwent pacemaker implantation September 2012   It is  her daughter's impression that she is better post pacing  She is still quite fatigued  She has some problems with shortness of breath with exertion. She has not had much peripheral edema.  Her daughter massages legs  She's had no recurrent syncope   Past Medical History:  Diagnosis Date  . Arthritis   . Atrial fibrillation (HCC)   . Chronic diastolic heart failure (HCC)    echo 05/2010: EF 60-65%; mild AI, mild MR; mod to severe TR; PASP 42     . HTN (hypertension)   . Pacemaker -St. Jude   . Sinus bradycardia   . Syncope     Past Surgical History:  Procedure Laterality Date  . CHOLECYSTECTOMY    . HERNIA REPAIR    . Status post carpal tunnel repair.      Current Outpatient Prescriptions  Medication Sig Dispense Refill  . acetaminophen (TYLENOL) 650 MG CR tablet Take 650 mg by mouth every 8 (eight) hours as needed.     [name] atorvastatin (LIPITOR) 80 MG tablet Take 80 mg by mouth daily.    [name] docusate sodium (COLACE) 100 MG capsule Take 100 mg by mouth daily as needed (CONSTIPATION).     . furosemide (LASIX) 40 MG tablet Take 60 mg by mouth daily.     [name] glucosamine-chondroitin 500-400 MG tablet Take 1 tablet by mouth daily.      . magnesium oxide (MAG-OX) 400 MG tablet Take 400 mg by mouth daily.      . metoprolol succinate (TOPROL-XL) 100 MG 24 hr tablet Take 100 mg by mouth daily. Take with or immediately following a meal.    . omeprazole (PRILOSEC) 40 MG capsule Take 40 mg by mouth daily.      . Rivaroxaban (XARELTO PO) Take by mouth daily. Pt is unsure of dosage    . vitamin C (ASCORBIC ACID) 500 MG tablet Take 500 mg by mouth daily.      . Zinc 25 MG TABS 1 tab po qd      No current facility-administered medications for this  visit.     Allergies  Allergen Reactions  . Aggrenox [Aspirin-Dipyridamole Er]   . Ginkgo Biloba   . Lisinopril   . Naproxen     Review of Systems negative except from HPI and PMH  Physical Exam BP 114/70   Pulse 63   Ht 5\' 4"  (1.626 m)   Wt 222 lb 3.2 oz (100.8 kg)   SpO2 91%   BMI 38.14 kg/m  Well developed and nourished in no acute distress all dressed in green HENT normal  Device pocket well healed; without hematoma or erythema.    Neck supple   Clear Regular rate and rhythm, 2/6 systolic M Abd-soft with active BS No Clubbing cyanosis trace edema Skin-warm and dry A & Oriented  Grossly normal sensory and motor function   ECG demonstrates   atrial pacing at 60 Intervals 19/09/41 Axis left -23 Assessment and  Plan  Paroxysmal atrial fibrillation  Pacemaker St Jude The patient's device was interrogated.  The information was reviewed. No changes were made in the programming.     Syncope  Bradycardia  She is doing relatively well from a cardiac point of view.  Not  active at this point Fatigue not unexpected. Most recent EF 55-65%  She continues on warfarin

## 2016-07-28 NOTE — Patient Instructions (Addendum)
Medication Instructions: - Your physician recommends that you continue on your current medications as directed. Please refer to the Current Medication list given to you today.  Labwork: - none ordered  Procedures/Testing: - none ordered  Follow-Up: - Remote monitoring is used to monitor your Pacemaker of ICD from home. This monitoring reduces the number of office visits required to check your device to one time per year. It allows us to keep an eye on the functioning of your device to ensure it is working properly. You are scheduled for a device check from home on 10/28/15. You may send your transmission at any time that day. If you have a wireless device, the transmission will be sent automatically. After your physician reviews your transmission, you will receive a postcard with your next transmission date.  - Your physician wants you to follow-up in: 1 year with Dr. Graciela HusbandsKlein. You will receive a reminder letter in the mail two months in advance. If you don't receive a letter, please call our office to schedule the follow-up appointment.  Any Additional Special Instructions Will Be Listed Below (If Applicable).     If you need a refill on your cardiac medications before your next appointment, please call your pharmacy.

## 2016-07-30 LAB — CUP PACEART INCLINIC DEVICE CHECK
Brady Statistic RV Percent Paced: 1.6 %
Date Time Interrogation Session: 20171010202030
Implantable Lead Implant Date: 20120921
Lead Channel Impedance Value: 650 Ohm
Lead Channel Pacing Threshold Amplitude: 0.5 V
Lead Channel Pacing Threshold Amplitude: 0.5 V
Lead Channel Pacing Threshold Pulse Width: 0.5 ms
Lead Channel Sensing Intrinsic Amplitude: 12 mV
Lead Channel Setting Pacing Amplitude: 0.75 V
Lead Channel Setting Pacing Pulse Width: 0.5 ms
Lead Channel Setting Sensing Sensitivity: 2 mV
MDC IDC LEAD IMPLANT DT: 20120921
MDC IDC LEAD LOCATION: 753859
MDC IDC LEAD LOCATION: 753860
MDC IDC LEAD MODEL: 1948
MDC IDC MSMT BATTERY REMAINING LONGEVITY: 102
MDC IDC MSMT BATTERY VOLTAGE: 2.92 V
MDC IDC MSMT LEADCHNL RA IMPEDANCE VALUE: 525 Ohm
MDC IDC MSMT LEADCHNL RA PACING THRESHOLD AMPLITUDE: 0.5 V
MDC IDC MSMT LEADCHNL RA PACING THRESHOLD PULSEWIDTH: 0.5 ms
MDC IDC MSMT LEADCHNL RA PACING THRESHOLD PULSEWIDTH: 0.5 ms
MDC IDC MSMT LEADCHNL RA SENSING INTR AMPL: 0.8 mV
MDC IDC MSMT LEADCHNL RV PACING THRESHOLD AMPLITUDE: 0.5 V
MDC IDC MSMT LEADCHNL RV PACING THRESHOLD PULSEWIDTH: 0.5 ms
MDC IDC SET LEADCHNL RA PACING AMPLITUDE: 1.5 V
MDC IDC STAT BRADY RA PERCENT PACED: 90 %
Pulse Gen Model: 2110
Pulse Gen Serial Number: 7281691

## 2016-10-27 ENCOUNTER — Ambulatory Visit (INDEPENDENT_AMBULATORY_CARE_PROVIDER_SITE_OTHER): Payer: Medicare Other | Admitting: *Deleted

## 2016-10-27 ENCOUNTER — Telehealth: Payer: Self-pay | Admitting: Cardiology

## 2016-10-27 DIAGNOSIS — I495 Sick sinus syndrome: Secondary | ICD-10-CM | POA: Diagnosis not present

## 2016-10-27 NOTE — Telephone Encounter (Signed)
LMOVM reminding pt to send remote transmission.   

## 2016-10-28 NOTE — Progress Notes (Signed)
Remote pacemaker transmission.   

## 2016-10-29 ENCOUNTER — Encounter: Payer: Self-pay | Admitting: Cardiology

## 2016-11-12 LAB — CUP PACEART REMOTE DEVICE CHECK
Brady Statistic AP VS Percent: 81 %
Brady Statistic AS VP Percent: 1.1 %
Brady Statistic AS VS Percent: 13 %
Date Time Interrogation Session: 20180110162925
Implantable Lead Implant Date: 20120921
Implantable Lead Location: 753860
Lead Channel Pacing Threshold Amplitude: 0.5 V
Lead Channel Pacing Threshold Amplitude: 0.5 V
Lead Channel Pacing Threshold Pulse Width: 0.5 ms
Lead Channel Sensing Intrinsic Amplitude: 12 mV
Lead Channel Setting Pacing Amplitude: 0.75 V
Lead Channel Setting Pacing Amplitude: 1.5 V
MDC IDC LEAD IMPLANT DT: 20120921
MDC IDC LEAD LOCATION: 753859
MDC IDC MSMT BATTERY REMAINING LONGEVITY: 98 mo
MDC IDC MSMT BATTERY REMAINING PERCENTAGE: 73 %
MDC IDC MSMT BATTERY VOLTAGE: 2.92 V
MDC IDC MSMT LEADCHNL RA IMPEDANCE VALUE: 480 Ohm
MDC IDC MSMT LEADCHNL RA PACING THRESHOLD PULSEWIDTH: 0.5 ms
MDC IDC MSMT LEADCHNL RA SENSING INTR AMPL: 1.6 mV
MDC IDC MSMT LEADCHNL RV IMPEDANCE VALUE: 650 Ohm
MDC IDC PG IMPLANT DT: 20120921
MDC IDC SET LEADCHNL RV PACING PULSEWIDTH: 0.5 ms
MDC IDC SET LEADCHNL RV SENSING SENSITIVITY: 2 mV
MDC IDC STAT BRADY AP VP PERCENT: 4.8 %
MDC IDC STAT BRADY RA PERCENT PACED: 81 %
MDC IDC STAT BRADY RV PERCENT PACED: 6.3 %
Pulse Gen Model: 2110
Pulse Gen Serial Number: 7281691

## 2017-01-27 ENCOUNTER — Ambulatory Visit (INDEPENDENT_AMBULATORY_CARE_PROVIDER_SITE_OTHER): Payer: Medicare Other | Admitting: *Deleted

## 2017-01-27 ENCOUNTER — Telehealth: Payer: Self-pay | Admitting: Cardiology

## 2017-01-27 DIAGNOSIS — I495 Sick sinus syndrome: Secondary | ICD-10-CM

## 2017-01-27 NOTE — Telephone Encounter (Signed)
Confirmed remote transmission w/ pt daughter.   

## 2017-01-28 ENCOUNTER — Encounter: Payer: Self-pay | Admitting: Cardiology

## 2017-01-28 NOTE — Progress Notes (Signed)
Remote pacemaker transmission.   

## 2017-04-28 ENCOUNTER — Ambulatory Visit (INDEPENDENT_AMBULATORY_CARE_PROVIDER_SITE_OTHER): Payer: Medicare Other | Admitting: *Deleted

## 2017-04-28 DIAGNOSIS — I495 Sick sinus syndrome: Secondary | ICD-10-CM

## 2017-04-28 NOTE — Progress Notes (Signed)
Remote pacemaker transmission.   

## 2017-05-04 ENCOUNTER — Encounter: Payer: Self-pay | Admitting: Cardiology

## 2017-05-17 LAB — CUP PACEART REMOTE DEVICE CHECK
Battery Remaining Longevity: 98 mo
Battery Remaining Percentage: 73 %
Battery Voltage: 2.92 V
Brady Statistic AP VP Percent: 2.3 %
Brady Statistic AP VS Percent: 86 %
Brady Statistic AS VP Percent: 1 %
Brady Statistic AS VS Percent: 11 %
Brady Statistic RV Percent Paced: 3.3 %
Implantable Lead Implant Date: 20120921
Implantable Pulse Generator Implant Date: 20120921
Lead Channel Impedance Value: 480 Ohm
Lead Channel Impedance Value: 690 Ohm
Lead Channel Pacing Threshold Amplitude: 0.5 V
Lead Channel Pacing Threshold Amplitude: 0.5 V
Lead Channel Pacing Threshold Pulse Width: 0.5 ms
Lead Channel Sensing Intrinsic Amplitude: 1.2 mV
Lead Channel Setting Sensing Sensitivity: 2 mV
MDC IDC LEAD IMPLANT DT: 20120921
MDC IDC LEAD LOCATION: 753859
MDC IDC LEAD LOCATION: 753860
MDC IDC MSMT LEADCHNL RA PACING THRESHOLD PULSEWIDTH: 0.5 ms
MDC IDC MSMT LEADCHNL RV SENSING INTR AMPL: 11.8 mV
MDC IDC PG SERIAL: 7281691
MDC IDC SESS DTM: 20180710141949
MDC IDC SET LEADCHNL RA PACING AMPLITUDE: 1.5 V
MDC IDC SET LEADCHNL RV PACING AMPLITUDE: 0.75 V
MDC IDC SET LEADCHNL RV PACING PULSEWIDTH: 0.5 ms
MDC IDC STAT BRADY RA PERCENT PACED: 83 %

## 2017-07-28 ENCOUNTER — Ambulatory Visit (INDEPENDENT_AMBULATORY_CARE_PROVIDER_SITE_OTHER): Payer: Medicare Other | Admitting: *Deleted

## 2017-07-28 ENCOUNTER — Telehealth: Payer: Self-pay | Admitting: Cardiology

## 2017-07-28 DIAGNOSIS — I495 Sick sinus syndrome: Secondary | ICD-10-CM | POA: Diagnosis not present

## 2017-07-28 NOTE — Telephone Encounter (Signed)
LMOVM reminding pt to send remote transmission.   

## 2017-07-28 NOTE — Progress Notes (Signed)
Remote pacemaker transmission.   

## 2017-07-30 ENCOUNTER — Encounter: Payer: Self-pay | Admitting: Cardiology

## 2017-08-10 ENCOUNTER — Encounter: Payer: Self-pay | Admitting: Internal Medicine

## 2017-08-10 ENCOUNTER — Ambulatory Visit (INDEPENDENT_AMBULATORY_CARE_PROVIDER_SITE_OTHER): Payer: Medicare Other | Admitting: Internal Medicine

## 2017-08-10 VITALS — BP 120/70 | HR 103 | Ht 64.0 in | Wt 213.2 lb

## 2017-08-10 DIAGNOSIS — Z950 Presence of cardiac pacemaker: Secondary | ICD-10-CM

## 2017-08-10 DIAGNOSIS — I480 Paroxysmal atrial fibrillation: Secondary | ICD-10-CM

## 2017-08-10 DIAGNOSIS — I495 Sick sinus syndrome: Secondary | ICD-10-CM

## 2017-08-10 DIAGNOSIS — R001 Bradycardia, unspecified: Secondary | ICD-10-CM | POA: Diagnosis not present

## 2017-08-10 NOTE — Progress Notes (Signed)
Patient Care Team: Patient, No Pcp Per as PCP - General (General Practice)   HPI  Connie Weaver is a 83 y.o. female Seen in followup for paroxysmal atrial fibrillation and diastolic heart failure and bradycardia for which she underwent pacemaker implantation September 2012   It is  her daughter's impression that she is better post pacing  She is still quite fatigued      She has episodic shortness of breath. She's had problems with some edema. She was seen in the emergency room and was told she was dehydrated and so is the push fluids.  She's had problems with recurrent infections and other issues associated with drinking carbonated sodas  She's had no recurrent syncope   Past Medical History:  Diagnosis Date  . Arthritis   . Atrial fibrillation (HCC)   . Chronic diastolic heart failure (HCC)    echo 05/2010: EF 60-65%; mild AI, mild MR; mod to severe TR; PASP 42     . HTN (hypertension)   . Pacemaker -St. Jude   . Sinus bradycardia   . Syncope     Past Surgical History:  Procedure Laterality Date  . CHOLECYSTECTOMY    . HERNIA REPAIR    . Status post carpal tunnel repair.      Current Outpatient Prescriptions  Medication Sig Dispense Refill  . acetaminophen (TYLENOL) 650 MG CR tablet Take 650 mg by mouth every 8 (eight) hours as needed.     [name]. atorvastatin (LIPITOR) 80 MG tablet Take 80 mg by mouth daily.    [name]. docusate sodium (COLACE) 100 MG capsule Take 100 mg by mouth daily as needed (CONSTIPATION).     . furosemide (LASIX) 40 MG tablet Take 60 mg by mouth daily.     [name]. glucosamine-chondroitin 500-400 MG tablet Take 1 tablet by mouth daily.      . magnesium oxide (MAG-OX) 400 MG tablet Take 400 mg by mouth daily.      . metoprolol succinate (TOPROL-XL) 100 MG 24 hr tablet Take 100 mg by mouth daily. Take with or immediately following a meal.    . omeprazole (PRILOSEC) 40 MG capsule Take 40 mg by mouth daily.      . Rivaroxaban (XARELTO PO) Take 1 Dose by mouth  daily. Pt is unsure of dosage     . vitamin C (ASCORBIC ACID) 500 MG tablet Take 500 mg by mouth daily.      . Zinc 25 MG TABS Take 25 mg by mouth daily. 1 tab po qd      No current facility-administered medications for this visit.     Allergies  Allergen Reactions  . Aggrenox [Aspirin-Dipyridamole Er]     Doesn't recall  . Ginkgo Biloba     Doesn't recall  . Lisinopril     Doesn't recall  . Naproxen     Doesn't recall    Review of Systems negative except from HPI and PMH  Physical Exam BP 120/70   Pulse (!) 103   Ht 5\' 4"  (1.626 m)   Wt 213 lb 3.2 oz (96.7 kg)   BMI 36.60 kg/m  Well developed and nourished in no acute distress in a wheel chair  HENT normal Neck supple   Crackles  Regular rate and rhythm, no murmurs or gallops Abd-soft with active BS without hepatomegaly No Clubbing cyanosis tr edema Skin-warm and dry A & Oriented  Grossly normal sensory and motor function    ECG demonstrates  Atrial fluttter @  103 Intervals -/08/351 Axis left -23 Assessment and  Plan  Paroxysmal atrial fibrillation  Pacemaker St Jude The patient's device was interrogated.  The information was reviewed. No changes were made in the programming.     Syncope  HFpEF  Bradycardia  Recurrent atrial arrhythmias do not seem to track with dyspnea.   she is volume overloaded, however, given the history of dehydration and renal insufficiency I am reluctant to push her diuretics. We have reviewed the issues of sodium intake and have encouraged her to decrease or discontinue her Gatorade Now we will leave her diuretics as they are Her  atrial arrhythmias are intermittent   We spent more than 50% of our >25 min visit in face to face counseling regarding the above

## 2017-08-10 NOTE — Patient Instructions (Addendum)
Medication Instructions: Your physician recommends that you continue on your current medications as directed. Please refer to the Current Medication list given to you today.  Labwork: None Ordered  Procedures/Testing: None Ordered  Follow-Up: Remote monitoring is used to monitor your Pacemaker from home. This monitoring reduces the number of office visits required to check your device to one time per year. It allows us to keep an eye on the functioning of your device to ensure it is working properly. You are scheduled for a device check from home on 11/09/17. You may send your transmission at any time that day. If you have a wireless device, the transmission will be sent automatically. After your physician reviews your transmission, you will receive a postcard with your next transmission date.  Your physician wants you to follow-up in: 1 YEAR with Mervyn GayAmber Sieler, NP. You will receive a reminder letter in the mail two months in advance. If you don't receive a letter, please call our office to schedule the follow-up appointment.    If you need a refill on your cardiac medications before your next appointment, please call your pharmacy.

## 2017-08-17 LAB — CUP PACEART REMOTE DEVICE CHECK
Battery Remaining Longevity: 87 mo
Battery Remaining Percentage: 65 %
Brady Statistic AP VS Percent: 85 %
Brady Statistic RA Percent Paced: 75 %
Brady Statistic RV Percent Paced: 3.4 %
Date Time Interrogation Session: 20181010144735
Implantable Lead Location: 753859
Implantable Lead Model: 1948
Lead Channel Impedance Value: 450 Ohm
Lead Channel Pacing Threshold Pulse Width: 0.5 ms
Lead Channel Sensing Intrinsic Amplitude: 9.7 mV
Lead Channel Setting Pacing Amplitude: 1.5 V
Lead Channel Setting Sensing Sensitivity: 2 mV
MDC IDC LEAD IMPLANT DT: 20120921
MDC IDC LEAD IMPLANT DT: 20120921
MDC IDC LEAD LOCATION: 753860
MDC IDC MSMT BATTERY VOLTAGE: 2.9 V
MDC IDC MSMT LEADCHNL RA PACING THRESHOLD AMPLITUDE: 0.5 V
MDC IDC MSMT LEADCHNL RA SENSING INTR AMPL: 1.1 mV
MDC IDC MSMT LEADCHNL RV IMPEDANCE VALUE: 630 Ohm
MDC IDC MSMT LEADCHNL RV PACING THRESHOLD AMPLITUDE: 0.5 V
MDC IDC MSMT LEADCHNL RV PACING THRESHOLD PULSEWIDTH: 0.5 ms
MDC IDC PG IMPLANT DT: 20120921
MDC IDC PG SERIAL: 7281691
MDC IDC SET LEADCHNL RV PACING AMPLITUDE: 0.75 V
MDC IDC SET LEADCHNL RV PACING PULSEWIDTH: 0.5 ms
MDC IDC STAT BRADY AP VP PERCENT: 2 %
MDC IDC STAT BRADY AS VP PERCENT: 1 %
MDC IDC STAT BRADY AS VS PERCENT: 12 %

## 2017-08-21 LAB — CUP PACEART INCLINIC DEVICE CHECK
Battery Remaining Longevity: 80 mo
Battery Voltage: 2.9 V
Brady Statistic RA Percent Paced: 73 %
Brady Statistic RV Percent Paced: 3.7 %
Date Time Interrogation Session: 20181023203850
Implantable Lead Implant Date: 20120921
Implantable Lead Location: 753860
Implantable Pulse Generator Implant Date: 20120921
Lead Channel Impedance Value: 475 Ohm
Lead Channel Pacing Threshold Amplitude: 0.5 V
Lead Channel Pacing Threshold Pulse Width: 0.5 ms
Lead Channel Pacing Threshold Pulse Width: 0.5 ms
Lead Channel Sensing Intrinsic Amplitude: 1.1 mV
Lead Channel Setting Pacing Amplitude: 5 V
MDC IDC LEAD IMPLANT DT: 20120921
MDC IDC LEAD LOCATION: 753859
MDC IDC MSMT LEADCHNL RV IMPEDANCE VALUE: 687.5 Ohm
MDC IDC MSMT LEADCHNL RV PACING THRESHOLD AMPLITUDE: 0.5 V
MDC IDC MSMT LEADCHNL RV SENSING INTR AMPL: 12 mV
MDC IDC PG SERIAL: 7281691
MDC IDC SET LEADCHNL RA PACING AMPLITUDE: 1.5 V
MDC IDC SET LEADCHNL RV PACING PULSEWIDTH: 0.5 ms
MDC IDC SET LEADCHNL RV SENSING SENSITIVITY: 2 mV

## 2017-10-27 ENCOUNTER — Telehealth: Payer: Self-pay | Admitting: Cardiology

## 2017-10-27 ENCOUNTER — Ambulatory Visit (INDEPENDENT_AMBULATORY_CARE_PROVIDER_SITE_OTHER): Payer: Medicare Other | Admitting: *Deleted

## 2017-10-27 DIAGNOSIS — I495 Sick sinus syndrome: Secondary | ICD-10-CM

## 2017-10-27 NOTE — Telephone Encounter (Signed)
Confirmed remote transmission w/ pt daughter.   

## 2017-10-28 ENCOUNTER — Encounter: Payer: Self-pay | Admitting: Cardiology

## 2017-10-28 NOTE — Progress Notes (Signed)
Remote pacemaker transmission.   

## 2017-11-01 LAB — CUP PACEART REMOTE DEVICE CHECK
Battery Remaining Longevity: 77 mo
Battery Voltage: 2.89 V
Brady Statistic AS VP Percent: 61 %
Brady Statistic RA Percent Paced: 1 %
Date Time Interrogation Session: 20190110142811
Implantable Lead Implant Date: 20120921
Implantable Lead Location: 753859
Implantable Pulse Generator Implant Date: 20120921
Lead Channel Impedance Value: 450 Ohm
Lead Channel Pacing Threshold Amplitude: 0.5 V
Lead Channel Pacing Threshold Pulse Width: 0.5 ms
Lead Channel Pacing Threshold Pulse Width: 0.5 ms
Lead Channel Setting Pacing Amplitude: 0.75 V
Lead Channel Setting Sensing Sensitivity: 2 mV
MDC IDC LEAD IMPLANT DT: 20120921
MDC IDC LEAD LOCATION: 753860
MDC IDC MSMT BATTERY REMAINING PERCENTAGE: 57 %
MDC IDC MSMT LEADCHNL RA SENSING INTR AMPL: 1.1 mV
MDC IDC MSMT LEADCHNL RV IMPEDANCE VALUE: 610 Ohm
MDC IDC MSMT LEADCHNL RV PACING THRESHOLD AMPLITUDE: 0.5 V
MDC IDC MSMT LEADCHNL RV SENSING INTR AMPL: 10.1 mV
MDC IDC SET LEADCHNL RA PACING AMPLITUDE: 1.5 V
MDC IDC SET LEADCHNL RV PACING PULSEWIDTH: 0.5 ms
MDC IDC STAT BRADY AP VP PERCENT: 39 %
MDC IDC STAT BRADY AP VS PERCENT: 0 %
MDC IDC STAT BRADY AS VS PERCENT: 0 %
MDC IDC STAT BRADY RV PERCENT PACED: 18 %
Pulse Gen Model: 2110
Pulse Gen Serial Number: 7281691

## 2018-01-26 ENCOUNTER — Ambulatory Visit (INDEPENDENT_AMBULATORY_CARE_PROVIDER_SITE_OTHER): Payer: Medicare Other | Admitting: *Deleted

## 2018-01-26 ENCOUNTER — Telehealth: Payer: Self-pay | Admitting: Cardiology

## 2018-01-26 DIAGNOSIS — I495 Sick sinus syndrome: Secondary | ICD-10-CM | POA: Diagnosis not present

## 2018-01-26 NOTE — Telephone Encounter (Signed)
Confirmed remote transmission w/ pt daughter.   

## 2018-01-27 ENCOUNTER — Encounter: Payer: Self-pay | Admitting: Cardiology

## 2018-01-27 NOTE — Progress Notes (Signed)
Remote pacemaker transmission.   

## 2018-02-25 LAB — CUP PACEART REMOTE DEVICE CHECK
Battery Voltage: 2.89 V
Brady Statistic AP VP Percent: 14 %
Brady Statistic AP VS Percent: 9.1 %
Brady Statistic RA Percent Paced: 1 %
Date Time Interrogation Session: 20190410214840
Implantable Lead Implant Date: 20120921
Implantable Lead Location: 753859
Implantable Lead Location: 753860
Implantable Pulse Generator Implant Date: 20120921
Lead Channel Impedance Value: 450 Ohm
Lead Channel Impedance Value: 630 Ohm
Lead Channel Pacing Threshold Amplitude: 0.5 V
Lead Channel Pacing Threshold Amplitude: 0.5 V
Lead Channel Pacing Threshold Pulse Width: 0.5 ms
MDC IDC LEAD IMPLANT DT: 20120921
MDC IDC MSMT BATTERY REMAINING LONGEVITY: 58 mo
MDC IDC MSMT BATTERY REMAINING PERCENTAGE: 57 %
MDC IDC MSMT LEADCHNL RA SENSING INTR AMPL: 1.1 mV
MDC IDC MSMT LEADCHNL RV PACING THRESHOLD PULSEWIDTH: 0.5 ms
MDC IDC MSMT LEADCHNL RV SENSING INTR AMPL: 9.6 mV
MDC IDC SET LEADCHNL RA PACING AMPLITUDE: 1.5 V
MDC IDC SET LEADCHNL RV PACING AMPLITUDE: 5 V
MDC IDC SET LEADCHNL RV PACING PULSEWIDTH: 0.5 ms
MDC IDC SET LEADCHNL RV SENSING SENSITIVITY: 2 mV
MDC IDC STAT BRADY AS VP PERCENT: 53 %
MDC IDC STAT BRADY AS VS PERCENT: 5.7 %
MDC IDC STAT BRADY RV PERCENT PACED: 20 %
Pulse Gen Model: 2110
Pulse Gen Serial Number: 7281691

## 2018-04-27 ENCOUNTER — Ambulatory Visit (INDEPENDENT_AMBULATORY_CARE_PROVIDER_SITE_OTHER): Payer: Medicare Other | Admitting: *Deleted

## 2018-04-27 DIAGNOSIS — I495 Sick sinus syndrome: Secondary | ICD-10-CM

## 2018-04-28 NOTE — Progress Notes (Signed)
Remote pacemaker transmission.   

## 2018-06-01 LAB — CUP PACEART REMOTE DEVICE CHECK
Battery Remaining Longevity: 52 mo
Battery Remaining Percentage: 51 %
Battery Voltage: 2.87 V
Brady Statistic AP VP Percent: 12 %
Brady Statistic AP VS Percent: 12 %
Brady Statistic RA Percent Paced: 1 %
Brady Statistic RV Percent Paced: 19 %
Implantable Lead Implant Date: 20120921
Implantable Lead Location: 753859
Implantable Lead Model: 1948
Implantable Pulse Generator Implant Date: 20120921
Lead Channel Impedance Value: 610 Ohm
Lead Channel Pacing Threshold Amplitude: 0.5 V
Lead Channel Pacing Threshold Amplitude: 0.5 V
Lead Channel Pacing Threshold Pulse Width: 0.5 ms
Lead Channel Sensing Intrinsic Amplitude: 1.1 mV
Lead Channel Setting Pacing Pulse Width: 0.5 ms
Lead Channel Setting Sensing Sensitivity: 2 mV
MDC IDC LEAD IMPLANT DT: 20120921
MDC IDC LEAD LOCATION: 753860
MDC IDC MSMT LEADCHNL RA IMPEDANCE VALUE: 440 Ohm
MDC IDC MSMT LEADCHNL RV PACING THRESHOLD PULSEWIDTH: 0.5 ms
MDC IDC MSMT LEADCHNL RV SENSING INTR AMPL: 9.1 mV
MDC IDC PG SERIAL: 7281691
MDC IDC SESS DTM: 20190710141355
MDC IDC SET LEADCHNL RA PACING AMPLITUDE: 1.5 V
MDC IDC SET LEADCHNL RV PACING AMPLITUDE: 5 V
MDC IDC STAT BRADY AS VP PERCENT: 51 %
MDC IDC STAT BRADY AS VS PERCENT: 7.5 %

## 2018-07-19 DEATH — deceased

## 2018-08-10 ENCOUNTER — Encounter: Payer: Medicare Other | Admitting: Internal Medicine
